# Patient Record
Sex: Female | Born: 1975 | Race: White | Hispanic: No | Marital: Married | Smoking: Never smoker
Health system: Southern US, Community
[De-identification: ages and names within clinical notes are randomized; demographics above are authoritative.]

## PROBLEM LIST (undated history)

## (undated) DIAGNOSIS — Z8742 Personal history of other diseases of the female genital tract: Secondary | ICD-10-CM

## (undated) DIAGNOSIS — R51 Headache: Secondary | ICD-10-CM

## (undated) DIAGNOSIS — R519 Headache, unspecified: Secondary | ICD-10-CM

## (undated) DIAGNOSIS — J302 Other seasonal allergic rhinitis: Secondary | ICD-10-CM

## (undated) DIAGNOSIS — Z9889 Other specified postprocedural states: Secondary | ICD-10-CM

## (undated) DIAGNOSIS — B019 Varicella without complication: Secondary | ICD-10-CM

## (undated) DIAGNOSIS — I10 Essential (primary) hypertension: Secondary | ICD-10-CM

## (undated) HISTORY — PX: ENDOMETRIAL ABLATION: SHX621

## (undated) HISTORY — DX: Other seasonal allergic rhinitis: J30.2

## (undated) HISTORY — DX: Essential (primary) hypertension: I10

## (undated) HISTORY — DX: Other specified postprocedural states: Z98.890

## (undated) HISTORY — DX: Varicella without complication: B01.9

## (undated) HISTORY — DX: Headache, unspecified: R51.9

## (undated) HISTORY — DX: Personal history of other diseases of the female genital tract: Z87.42

## (undated) HISTORY — DX: Headache: R51

---

## 1997-08-21 HISTORY — PX: OTHER SURGICAL HISTORY: SHX169

## 1998-03-02 ENCOUNTER — Other Ambulatory Visit: Admission: RE | Admit: 1998-03-02 | Discharge: 1998-03-02 | Payer: Self-pay | Admitting: *Deleted

## 1998-09-17 ENCOUNTER — Emergency Department (HOSPITAL_COMMUNITY): Admission: EM | Admit: 1998-09-17 | Discharge: 1998-09-18 | Payer: Self-pay | Admitting: Internal Medicine

## 1999-03-14 ENCOUNTER — Other Ambulatory Visit: Admission: RE | Admit: 1999-03-14 | Discharge: 1999-03-14 | Payer: Self-pay | Admitting: Obstetrics and Gynecology

## 1999-08-22 HISTORY — PX: BREAST ENHANCEMENT SURGERY: SHX7

## 1999-09-03 ENCOUNTER — Emergency Department (HOSPITAL_COMMUNITY): Admission: EM | Admit: 1999-09-03 | Discharge: 1999-09-03 | Payer: Self-pay

## 2000-03-02 ENCOUNTER — Other Ambulatory Visit: Admission: RE | Admit: 2000-03-02 | Discharge: 2000-03-02 | Payer: Self-pay | Admitting: Family Medicine

## 2001-03-12 ENCOUNTER — Other Ambulatory Visit: Admission: RE | Admit: 2001-03-12 | Discharge: 2001-03-12 | Payer: Self-pay | Admitting: Family Medicine

## 2001-04-09 ENCOUNTER — Other Ambulatory Visit: Admission: RE | Admit: 2001-04-09 | Discharge: 2001-04-09 | Payer: Self-pay | Admitting: Family Medicine

## 2002-04-28 ENCOUNTER — Other Ambulatory Visit: Admission: RE | Admit: 2002-04-28 | Discharge: 2002-04-28 | Payer: Self-pay | Admitting: Family Medicine

## 2002-11-17 ENCOUNTER — Emergency Department (HOSPITAL_COMMUNITY): Admission: EM | Admit: 2002-11-17 | Discharge: 2002-11-17 | Payer: Self-pay | Admitting: Emergency Medicine

## 2003-05-12 ENCOUNTER — Other Ambulatory Visit: Admission: RE | Admit: 2003-05-12 | Discharge: 2003-05-12 | Payer: Self-pay | Admitting: Family Medicine

## 2003-07-30 ENCOUNTER — Ambulatory Visit (HOSPITAL_COMMUNITY): Admission: RE | Admit: 2003-07-30 | Discharge: 2003-07-30 | Payer: Self-pay | Admitting: Family Medicine

## 2004-05-24 ENCOUNTER — Other Ambulatory Visit: Admission: RE | Admit: 2004-05-24 | Discharge: 2004-05-24 | Payer: Self-pay | Admitting: *Deleted

## 2004-06-21 ENCOUNTER — Ambulatory Visit: Payer: Self-pay

## 2004-07-05 ENCOUNTER — Emergency Department (HOSPITAL_COMMUNITY): Admission: EM | Admit: 2004-07-05 | Discharge: 2004-07-05 | Payer: Self-pay | Admitting: Emergency Medicine

## 2005-06-27 ENCOUNTER — Other Ambulatory Visit: Admission: RE | Admit: 2005-06-27 | Discharge: 2005-06-27 | Payer: Self-pay | Admitting: Family Medicine

## 2006-04-17 ENCOUNTER — Other Ambulatory Visit: Admission: RE | Admit: 2006-04-17 | Discharge: 2006-04-17 | Payer: Self-pay | Admitting: Family Medicine

## 2007-03-09 ENCOUNTER — Inpatient Hospital Stay (HOSPITAL_COMMUNITY): Admission: AD | Admit: 2007-03-09 | Discharge: 2007-03-12 | Payer: Self-pay | Admitting: Obstetrics & Gynecology

## 2007-03-10 ENCOUNTER — Encounter (INDEPENDENT_AMBULATORY_CARE_PROVIDER_SITE_OTHER): Payer: Self-pay | Admitting: Obstetrics and Gynecology

## 2007-03-24 ENCOUNTER — Inpatient Hospital Stay (HOSPITAL_COMMUNITY): Admission: AD | Admit: 2007-03-24 | Discharge: 2007-03-24 | Payer: Self-pay | Admitting: Obstetrics & Gynecology

## 2007-08-22 HISTORY — PX: TUBAL LIGATION: SHX77

## 2007-08-22 HISTORY — PX: SHOULDER SURGERY: SHX246

## 2007-08-31 ENCOUNTER — Emergency Department (HOSPITAL_COMMUNITY): Admission: EM | Admit: 2007-08-31 | Discharge: 2007-08-31 | Payer: Self-pay | Admitting: Emergency Medicine

## 2007-10-07 ENCOUNTER — Encounter: Admission: RE | Admit: 2007-10-07 | Discharge: 2007-10-07 | Payer: Self-pay | Admitting: Specialist

## 2008-01-01 ENCOUNTER — Ambulatory Visit: Payer: Self-pay

## 2008-01-14 ENCOUNTER — Ambulatory Visit: Payer: Self-pay

## 2008-12-28 ENCOUNTER — Emergency Department (HOSPITAL_COMMUNITY): Admission: EM | Admit: 2008-12-28 | Discharge: 2008-12-28 | Payer: Self-pay | Admitting: Emergency Medicine

## 2009-10-19 ENCOUNTER — Ambulatory Visit (HOSPITAL_COMMUNITY): Admission: RE | Admit: 2009-10-19 | Discharge: 2009-10-19 | Payer: Self-pay | Admitting: Obstetrics and Gynecology

## 2009-11-09 ENCOUNTER — Ambulatory Visit (HOSPITAL_COMMUNITY): Admission: RE | Admit: 2009-11-09 | Discharge: 2009-11-09 | Payer: Self-pay | Admitting: Obstetrics and Gynecology

## 2010-11-13 LAB — CBC
HCT: 34.5 % — ABNORMAL LOW (ref 36.0–46.0)
Hemoglobin: 11.7 g/dL — ABNORMAL LOW (ref 12.0–15.0)
MCHC: 33.9 g/dL (ref 30.0–36.0)
MCV: 91.1 fL (ref 78.0–100.0)
Platelets: 216 10*3/uL (ref 150–400)
RBC: 3.79 MIL/uL — ABNORMAL LOW (ref 3.87–5.11)
RDW: 13.1 % (ref 11.5–15.5)
WBC: 9.6 10*3/uL (ref 4.0–10.5)

## 2010-11-13 LAB — HCG, SERUM, QUALITATIVE: Preg, Serum: NEGATIVE

## 2010-11-29 LAB — CSF CULTURE W GRAM STAIN
Culture: NO GROWTH
Gram Stain: NONE SEEN

## 2010-11-29 LAB — CBC
HCT: 39.5 % (ref 36.0–46.0)
Hemoglobin: 13.8 g/dL (ref 12.0–15.0)
MCHC: 34.9 g/dL (ref 30.0–36.0)
MCV: 90.6 fL (ref 78.0–100.0)
Platelets: 210 10*3/uL (ref 150–400)
RBC: 4.36 MIL/uL (ref 3.87–5.11)
RDW: 13.4 % (ref 11.5–15.5)
WBC: 14.8 10*3/uL — ABNORMAL HIGH (ref 4.0–10.5)

## 2010-11-29 LAB — URINE MICROSCOPIC-ADD ON

## 2010-11-29 LAB — DIFFERENTIAL
Basophils Absolute: 0 10*3/uL (ref 0.0–0.1)
Basophils Relative: 0 % (ref 0–1)
Eosinophils Absolute: 0.4 10*3/uL (ref 0.0–0.7)
Eosinophils Relative: 3 % (ref 0–5)
Lymphocytes Relative: 11 % — ABNORMAL LOW (ref 12–46)
Lymphs Abs: 1.6 10*3/uL (ref 0.7–4.0)
Monocytes Absolute: 1 10*3/uL (ref 0.1–1.0)
Monocytes Relative: 7 % (ref 3–12)
Neutro Abs: 11.8 10*3/uL — ABNORMAL HIGH (ref 1.7–7.7)
Neutrophils Relative %: 80 % — ABNORMAL HIGH (ref 43–77)

## 2010-11-29 LAB — POCT I-STAT, CHEM 8
BUN: 8 mg/dL (ref 6–23)
Calcium, Ion: 1.14 mmol/L (ref 1.12–1.32)
Chloride: 110 mEq/L (ref 96–112)
Creatinine, Ser: 0.8 mg/dL (ref 0.4–1.2)
Glucose, Bld: 100 mg/dL — ABNORMAL HIGH (ref 70–99)
HCT: 43 % (ref 36.0–46.0)
Hemoglobin: 14.6 g/dL (ref 12.0–15.0)
Potassium: 4.3 mEq/L (ref 3.5–5.1)
Sodium: 140 mEq/L (ref 135–145)
TCO2: 19 mmol/L (ref 0–100)

## 2010-11-29 LAB — URINALYSIS, ROUTINE W REFLEX MICROSCOPIC
Bilirubin Urine: NEGATIVE
Glucose, UA: NEGATIVE mg/dL
Ketones, ur: NEGATIVE mg/dL
Leukocytes, UA: NEGATIVE
Nitrite: NEGATIVE
Protein, ur: NEGATIVE mg/dL
Specific Gravity, Urine: 1.005 (ref 1.005–1.030)
Urobilinogen, UA: 0.2 mg/dL (ref 0.0–1.0)
pH: 6 (ref 5.0–8.0)

## 2010-11-29 LAB — PROTEIN, CSF: Total  Protein, CSF: 22 mg/dL (ref 15–45)

## 2010-11-29 LAB — CSF CELL COUNT WITH DIFFERENTIAL
RBC Count, CSF: 1 /mm3 — ABNORMAL HIGH
Tube #: 4
WBC, CSF: 2 /mm3 (ref 0–5)

## 2010-11-29 LAB — GRAM STAIN: Gram Stain: NONE SEEN

## 2010-11-29 LAB — GLUCOSE, CSF: Glucose, CSF: 64 mg/dL (ref 43–76)

## 2010-11-29 LAB — PREGNANCY, URINE: Preg Test, Ur: NEGATIVE

## 2011-01-03 NOTE — Op Note (Signed)
Catherine Barry, Catherine Barry             ACCOUNT NO.:  [id number]   MEDICAL RECORD NO.:  [id number]          PATIENT TYPE:  INP   LOCATION:  9123                          FACILITY:  [facility]   PHYSICIAN:  Lenoard Aden, M.D.DATE OF BIRTH:  [birth year]   DATE OF PROCEDURE:  03/10/2007  DATE OF DISCHARGE:                               OPERATIVE REPORT   PREOPERATIVE DIAGNOSIS:  Failure to descend, maternal fever in labor,  probable chorioamnionitis.   POSTOPERATIVE DIAGNOSIS:  Failure to descend, maternal fever in labor,  probable chorioamnionitis.  Plus left occiput posterior presentation.   PROCEDURE:  Primary low segment transverse cesarean section.   SURGEON:  Lenoard Aden, M.D.   ASSISTANT:  None.   ANESTHESIA:  Epidural.   BLOOD LOSS:  1000 mL.   COMPLICATIONS:  None.   DRAINS:  Foley.   COUNTS:  Correct.  The patient to recovery in good condition.  Placenta  to pathology.   FINDINGS:  Full-term living female left occiput posterior position,  Apgars of 08/09.  Normal tubes, normal ovaries.  Two-layer uterine  closure performed.   DESCRIPTION OF PROCEDURE:  After being apprised risks of anesthesia,  infection, bleeding, injury to abdominal organs with need for repair,  delayed versus immediate complications to include bowel and bladder  injury.  The patient brought to the operating room where she was  administered dosing of epidural anesthetic without complications,  prepped, draped in sterile fashion.  Foley catheter placed. After  achieving adequate anesthesia Marcaine solution placed and Pfannenstiel  skin incision made with a scalpel, carried down to fascia which is  nicked in the midline and opened transversely using Mayo scissors.  Rectus muscles dissected sharply midline peritoneum entered sharply.  Bladder blade placed.  Visceral peritoneum scored sharply off the lower  uterine segment.  Kerr hysterotomy incision made.  Atraumatic delivery  of fetus as noted  position, handed to pediatricians attendance.  Apgars  08/09.  Nuchal cord times one reduced, bulb suctioning performed.  Placenta delivered manually intact three-vessel cord.  Uterus was  curetted using a dry lap pack, palpable normal anterior uterine cavity.  Normal tubes, normal ovaries.  Two-layer uterine closure with a 0  Monocryl in continuous running fashion, placed in a second imbricating  layer as noted.  Bladder flap inspected, found to be hemostatic.  Irrigation accomplished.  Peritoneum  reapproximated using a 2-0 chromic in a continuous running fashion.  The  fascia closed using a #1 Monocryl in continuous running fashion.  Skin  closed using staples.  The patient tolerates procedure well and is  transferred to recovery in good condition.      Lenoard Aden, M.D.  Electronically Signed     RJT/MEDQ  D:  03/10/2007  T:  03/10/2007  Job:  [id number]

## 2011-01-06 NOTE — Discharge Summary (Signed)
NAMEDEMICA, ZOOK             ACCOUNT NO.:  [id number]   MEDICAL RECORD NO.:  [id number]          PATIENT TYPE:  INP   LOCATION:  9123                          FACILITY:  [facility]   PHYSICIAN:  Lenoard Aden, M.D.DATE OF BIRTH:  [birth year]   DATE OF ADMISSION:  03/09/2007  DATE OF DISCHARGE:  03/12/2007                               DISCHARGE SUMMARY   The patient underwent a complicated primary cesarean section March 10, 2007.  Postoperative course uncomplicated.  Tolerated a regular diet  well.  Discharged to home on March 12, 2007, without complications.  Tylox, prenatal vitamins and iron given.  Follow up in the office in 4-6  weeks.      Lenoard Aden, M.D.  Electronically Signed     RJT/MEDQ  D:  04/24/2007  T:  04/25/2007  Job:  [id number]

## 2011-06-05 LAB — CBC
HCT: 36.2
HCT: 31 — ABNORMAL LOW
Hemoglobin: 10.5 — ABNORMAL LOW
MCV: 89.3
MCV: 91.1
Platelets: 231
RBC: 4.05
RBC: 3.39 — ABNORMAL LOW
RDW: 14.4 — ABNORMAL HIGH
WBC: 12.8 — ABNORMAL HIGH
WBC: 17.9 — ABNORMAL HIGH

## 2011-06-05 LAB — COMPREHENSIVE METABOLIC PANEL
AST: 19
Albumin: 2.5 — ABNORMAL LOW
BUN: 11
CO2: 27
Chloride: 105
Chloride: 109
Creatinine, Ser: 0.94
Creatinine, Ser: 0.61
GFR calc Af Amer: >60
GFR calc non Af Amer: >60
Glucose, Bld: 94
Potassium: 4.1
Total Bilirubin: 0.7
Total Bilirubin: 0.4

## 2011-06-05 LAB — URINALYSIS, ROUTINE W REFLEX MICROSCOPIC
Glucose, UA: NEGATIVE
Ketones, ur: NEGATIVE
Protein, ur: NEGATIVE

## 2011-06-05 LAB — URIC ACID
Uric Acid, Serum: 5.2
Uric Acid, Serum: 4.4

## 2013-03-03 ENCOUNTER — Encounter: Payer: Self-pay | Admitting: Family Medicine

## 2013-03-03 ENCOUNTER — Ambulatory Visit (INDEPENDENT_AMBULATORY_CARE_PROVIDER_SITE_OTHER): Payer: BC Managed Care – HMO | Admitting: Family Medicine

## 2013-03-03 ENCOUNTER — Ambulatory Visit (INDEPENDENT_AMBULATORY_CARE_PROVIDER_SITE_OTHER): Payer: BC Managed Care – HMO

## 2013-03-03 VITALS — BP 131/91 | HR 89 | Temp 97.5°F | Wt 160.8 lb

## 2013-03-03 DIAGNOSIS — S6990XA Unspecified injury of unspecified wrist, hand and finger(s), initial encounter: Secondary | ICD-10-CM

## 2013-03-03 DIAGNOSIS — K219 Gastro-esophageal reflux disease without esophagitis: Secondary | ICD-10-CM

## 2013-03-03 DIAGNOSIS — M779 Enthesopathy, unspecified: Secondary | ICD-10-CM

## 2013-03-03 DIAGNOSIS — S6991XA Unspecified injury of right wrist, hand and finger(s), initial encounter: Secondary | ICD-10-CM

## 2013-03-03 MED ORDER — OMEPRAZOLE 20 MG PO CPDR: 20.0000 mg | DELAYED_RELEASE_CAPSULE | Freq: Every day | ORAL | Status: DC

## 2013-03-03 MED ORDER — HYDROCODONE-IBUPROFEN 5-200 MG PO TABS: 1.0000 | ORAL_TABLET | Freq: Three times a day (TID) | ORAL | Status: DC | PRN

## 2013-03-03 NOTE — Progress Notes (Signed)
  Subjective:    Patient ID: Catherine Barry, female    DOB: [birth year], 37 y.o.   MRN: [id number]  HPI This 37 y.o. female presents for evaluation of injury to right hand and fingers.  She was using a drill At home and she states the drill bit stuck into the wood and the tourque from the drill twisted he right Hand and now she cannot use it and has severe pain.  She states she hurts on the 4th and 3rd knuckle and  Cannot make a fist or extend her hand.  She cannot work and type.   Review of Systems C/o right hand pain and arthralgias. No chest pain, SOB, HA, dizziness, vision change, N/V, diarrhea, constipation, dysuria, urinary urgency or frequency, or rash.     Objective:   Physical Exam  Vital signs noted  Well developed well nourished female.  HEENT - Barry atraumatic Normocephalic Respiratory - Lungs CTA bilateral Cardiac - RRR S1 and S2 without murmur MS - TTP right 2nd and 3rd knuckle and tenderness with extension and flexion of right  Hand.  No deformities.  ROM is limited in right hand.  Preliminary xray interpretation: Xray of right hand is negative for fracture.      Assessment & Plan:  Hand injury, right, initial encounter - Plan: DG Hand Complete Right, omeprazole (PRILOSEC) 20 MG capsule, hydrocodone-ibuprofen (VICOPROFEN) 5-200 MG per tablet. Splint to right 3rd finger and hand placed.  Recommend no work for a week and then follow up in a week if not better.  May need referral to Ortho hand specialist if not better.  Tendonitis - Plan: omeprazole (PRILOSEC) 20 MG capsule, hydrocodone-ibuprofen (VICOPROFEN) 5-200 MG per tablet.  Continue aleve otc as directed.     GERD - Prilosec 20mg  po qd x 2 weeks, then prn.

## 2013-03-03 NOTE — Patient Instructions (Signed)
Hand Injuries Minor breaks (fractures), sprains, bruises (contusions), and burns of the hand are all examples of hand injuries. A fracture is a break in the bone. A sprain means that ligaments have been stretched or torn. A contusion is the result of an injury that caused bleeding under the skin. Burns are damage to the skin that occurs when the hand comes in contact with something very hot (or with certain chemicals).  HOME CARE INSTRUCTIONS  For sprains, keep your hand raised (elevated) above the level of your heart. Do this until the pain and swelling improve.  Use hand bandages (dressings) and splints to reduce motion, relieve pain, and prevent reinjury as directed. The dressing and splint should not be removed without your caregiver's approval.  Put ice on the injured area to reduce pain and swelling due to fractures, sprains, and deep bruises.  Put ice in a plastic bag.  Place a towel between your skin and the bag.  Leave the ice on for 15-20 minutes, 3-4 times a day. Do this for 2 3 days.  Only take over-the-counter or prescription medicines as directed by your caregiver.  Follow up with your caregiver or a specialist as directed. Early motion exercises are sometimes needed to reduce joint stiffness after a hand injury. However, your hand should not be used for any activities that increase pain. SEEK MEDICAL CARE IF:  Your pain or swelling does not improve in 2 3 days. SEEK IMMEDIATE MEDICAL CARE IF:  You have increased pain, swelling, or redness in your hand.  Your pain is not controlled with medicine.  You have a fever or persistent symptoms for more than 2 3 days.  You have a fever and your symptoms suddenly get worse.  You have pain when moving your fingers.  You have pus coming from the wound.  You have numbness in your fingers. MAKE SURE YOU:  Understand these instructions.  Will watch your condition.  Will get help right away if you are not doing well or get  worse. Document Released: 09/14/2004 Document Revised: 05/01/2012 Document Reviewed: 02/18/2012 [employer] Patient Information 2014 [employer].  

## 2014-01-08 ENCOUNTER — Encounter: Payer: Self-pay | Admitting: Physician Assistant

## 2014-01-08 ENCOUNTER — Ambulatory Visit (INDEPENDENT_AMBULATORY_CARE_PROVIDER_SITE_OTHER): Payer: Managed Care, Other (non HMO) | Admitting: Physician Assistant

## 2014-01-08 VITALS — BP 130/84 | HR 103 | Temp 99.0°F | Resp 18 | Wt 163.0 lb

## 2014-01-08 DIAGNOSIS — R5383 Other fatigue: Principal | ICD-10-CM

## 2014-01-08 DIAGNOSIS — R5381 Other malaise: Secondary | ICD-10-CM

## 2014-01-08 DIAGNOSIS — J309 Allergic rhinitis, unspecified: Secondary | ICD-10-CM

## 2014-01-08 DIAGNOSIS — G4700 Insomnia, unspecified: Secondary | ICD-10-CM

## 2014-01-08 LAB — VITAMIN B12: VITAMIN B 12: 224 pg/mL (ref 211–911)

## 2014-01-08 LAB — CBC WITH DIFFERENTIAL/PLATELET
BASOS PCT: 1.1 % (ref 0.0–3.0)
Basophils Absolute: 0.1 10*3/uL (ref 0.0–0.1)
EOS PCT: 7.5 % — AB (ref 0.0–5.0)
Eosinophils Absolute: 0.8 10*3/uL — ABNORMAL HIGH (ref 0.0–0.7)
HCT: 38.1 % (ref 36.0–46.0)
Hemoglobin: 12.8 g/dL (ref 12.0–15.0)
LYMPHS PCT: 24.2 % (ref 12.0–46.0)
Lymphs Abs: 2.5 10*3/uL (ref 0.7–4.0)
MCHC: 33.5 g/dL (ref 30.0–36.0)
MCV: 92.8 fl (ref 78.0–100.0)
MONOS PCT: 6.8 % (ref 3.0–12.0)
Monocytes Absolute: 0.7 10*3/uL (ref 0.1–1.0)
NEUTROS PCT: 60.4 % (ref 43.0–77.0)
Neutro Abs: 6.3 10*3/uL (ref 1.4–7.7)
PLATELETS: 290 10*3/uL (ref 150.0–400.0)
RBC: 4.11 Mil/uL (ref 3.87–5.11)
RDW: 14 % (ref 11.5–15.5)
WBC: 10.4 10*3/uL (ref 4.0–10.5)

## 2014-01-08 LAB — BASIC METABOLIC PANEL
BUN: 10 mg/dL (ref 6–23)
CHLORIDE: 106 meq/L (ref 96–112)
CO2: 29 meq/L (ref 19–32)
Calcium: 9.3 mg/dL (ref 8.4–10.5)
Creatinine, Ser: 1 mg/dL (ref 0.4–1.2)
GFR: 65.09 mL/min (ref >60.00)
GLUCOSE: 96 mg/dL (ref 70–99)
POTASSIUM: 4.2 meq/L (ref 3.5–5.1)
SODIUM: 142 meq/L (ref 135–145)

## 2014-01-08 LAB — IBC PANEL
IRON: 70 ug/dL (ref 42–145)
SATURATION RATIOS: 16.4 % — AB (ref 20.0–50.0)
TRANSFERRIN: 304.9 mg/dL (ref 212.0–360.0)

## 2014-01-08 LAB — TSH: TSH: 0.31 u[IU]/mL — ABNORMAL LOW (ref 0.35–4.50)

## 2014-01-08 LAB — HEMOGLOBIN A1C: HEMOGLOBIN A1C: 5.4 % (ref 4.6–6.5)

## 2014-01-08 LAB — T4, FREE: Free T4: 0.8 ng/dL (ref 0.60–1.60)

## 2014-01-08 MED ORDER — TRAZODONE HCL 50 MG PO TABS: 25.0000 mg | ORAL_TABLET | Freq: Every evening | ORAL | Status: DC | PRN

## 2014-01-08 NOTE — Progress Notes (Signed)
Pre visit review using our clinic review tool, if applicable. No additional management support is needed unless otherwise documented below in the visit note. 

## 2014-01-08 NOTE — Patient Instructions (Addendum)
We will call you with your lab results when they are available.  Trial of Trazodone half to one tablet at bedtime as needed for sleep.  Plain Over the Counter Mucinex (NOT Mucinex D) for thick secretions  Force NON dairy fluids, drinking plenty of water is best.    Over the Counter Flonase OR Nasacort AQ 1 spray in each nostril twice a day as needed. Use the "crossover" technique into opposite nostril spraying toward opposite ear @ 45 degree angle, not straight up into nostril.   Plain Over the Counter Allegra (NOT D )  160 daily , OR Loratidine 10 mg , OR Zyrtec 10 mg @ bedtime  as needed for itchy eyes & sneezing.  Saline Irrigation and Saline Sprays can also help reduce symptoms.  Follow up in about 2 weeks to reassess. Follow up sooner if symptoms worsen or fail to improve despite treatment.  Hay Fever  Hay fever is a type of allergy that people have to things like grass, animals, or pollen from plants and flowers. It cannot be passed from one person to another. You cannot cure hay fever, but there are things that may help relieve your problems (symptoms). HOME CARE  Avoid the things that may be causing your problems.  Take all medicine as told by your doctor. GET HELP RIGHT AWAY IF:  You have asthma, a cough, and you start making whistling sounds when breathing (wheezing).  Your tongue or lips are puffy (swollen).  You have trouble breathing.  You feel lightheaded or like you will pass out (faint).  You have a fever.  Your problems are getting worse and your medicine is not helping.  Your treatment was working, but your problems have come back.  You are stuffed up (congested) and have pressure in your face.  You have a headache.  You have cold sweats. MAKE SURE YOU:  Understand these instructions.  Will watch your condition.  Will get help right away if you are not doing well or get worse. Document Released: 12/07/2010 Document Revised: 10/30/2011 Document  Reviewed: 12/07/2010 [facility] Patient Information 2014 [place], [place]. Fatigue Fatigue is a feeling of tiredness, lack of energy, lack of motivation, or feeling tired all the time. Having enough rest, good nutrition, and reducing stress will normally reduce fatigue. Consult your caregiver if it persists. The nature of your fatigue will help your caregiver to find out its cause. The treatment is based on the cause.  CAUSES  There are many causes for fatigue. Most of the time, fatigue can be traced to one or more of your habits or routines. Most causes fit into one or more of three general areas. They are: Lifestyle problems  Sleep disturbances.  Overwork.  Physical exertion.  Unhealthy habits.  Poor eating habits or eating disorders.  Alcohol and/or drug use .  Lack of proper nutrition (malnutrition). Psychological problems  Stress and/or anxiety problems.  Depression.  Grief.  Boredom. Medical Problems or Conditions  Anemia.  Pregnancy.  Thyroid gland problems.  Recovery from major surgery.  Continuous pain.  Emphysema or asthma that is not well controlled  Allergic conditions.  Diabetes.  Infections (such as mononucleosis).  Obesity.  Sleep disorders, such as sleep apnea.  Heart failure or other heart-related problems.  Cancer.  Kidney disease.  Liver disease.  Effects of certain medicines such as antihistamines, cough and cold remedies, prescription pain medicines, heart and blood pressure medicines, drugs used for treatment of cancer, and some antidepressants. SYMPTOMS  The symptoms  of fatigue include:   Lack of energy.  Lack of drive (motivation).  Drowsiness.  Feeling of indifference to the surroundings. DIAGNOSIS  The details of how you feel help guide your caregiver in finding out what is causing the fatigue. You will be asked about your present and past health condition. It is important to review all medicines that you take, including  prescription and non-prescription items. A thorough exam will be done. You will be questioned about your feelings, habits, and normal lifestyle. Your caregiver may suggest blood tests, urine tests, or other tests to look for common medical causes of fatigue.  TREATMENT  Fatigue is treated by correcting the underlying cause. For example, if you have continuous pain or depression, treating these causes will improve how you feel. Similarly, adjusting the dose of certain medicines will help in reducing fatigue.  HOME CARE INSTRUCTIONS   Try to get the required amount of good sleep every night.  Eat a healthy and nutritious diet, and drink enough water throughout the day.  Practice ways of relaxing (including yoga or meditation).  Exercise regularly.  Make plans to change situations that cause stress. Act on those plans so that stresses decrease over time. Keep your work and personal routine reasonable.  Avoid street drugs and minimize use of alcohol.  Start taking a daily multivitamin after consulting your caregiver. SEEK MEDICAL CARE IF:   You have persistent tiredness, which cannot be accounted for.  You have fever.  You have unintentional weight loss.  You have headaches.  You have disturbed sleep throughout the night.  You are feeling sad.  You have constipation.  You have dry skin.  You have gained weight.  You are taking any new or different medicines that you suspect are causing fatigue.  You are unable to sleep at night.  You develop any unusual swelling of your legs or other parts of your body. SEEK IMMEDIATE MEDICAL CARE IF:   You are feeling confused.  Your vision is blurred.  You feel faint or pass out.  You develop severe headache.  You develop severe abdominal, pelvic, or back pain.  You develop chest pain, shortness of breath, or an irregular or fast heartbeat.  You are unable to pass a normal amount of urine.  You develop abnormal bleeding such  as bleeding from the rectum or you vomit blood.  You have thoughts about harming yourself or committing suicide.  You are worried that you might harm someone else. MAKE SURE YOU:   Understand these instructions.  Will watch your condition.  Will get help right away if you are not doing well or get worse. Document Released: 06/04/2007 Document Revised: 10/30/2011 Document Reviewed: 06/04/2007 [facility] Patient Information 2014 [place].

## 2014-01-08 NOTE — Progress Notes (Signed)
Subjective:    Patient ID: Catherine Barry, female    DOB: Jan 06, 1976, 38 y.o.   MRN: [id number]  HPI Patient is a 38 year old Caucasian female presenting to the clinic for allergies and fatigue.  The first complaint is allergies. The patient states that over the last week she has been experiencing runny nose, itchy watery eyes, lightheadedness, and sinus drainage. She states that she has been taking Benadryl which has offered mild relief.  Second complaint is fatigue. Patient states that for the past month she has been fatigued and has not been sleeping well. Patient states that she is able to fall asleep and has a bedtime between 9 and 10 PM every night, however she always wakes up around 2 AM and is unable to fall back asleep. The patient has tried Ambien in the past however this made her sleep walk, and she has been taking Benadryl for allergies which makes her tired, however does help her stay asleep. She states that in the past she has had low iron, and her mother has a history of thyroid problems. She also reports a possible 10 pound weight gain over the past month, however she normally checks her weight with an at home scale which she has not noticed any significant weight gain by, and only noticed a 10 pound weight gain with the clinic scale today as compared with her home scale. She denies fevers, nausea, vomiting, diarrhea, shortness of breath, chest pain, vertigo, syncope.   Review of Systems As per history of present illness and are otherwise negative.   History reviewed. No pertinent past medical history. History reviewed. No pertinent past surgical history.  reports that she has never smoked. She does not have any smokeless tobacco history on file. She reports that she does not drink alcohol or use illicit drugs. family history is not on file. Allergies  Allergen Reactions  . Codeine   . Penicillins Rash    The PFS was reviewed with pt at time of visit.    Objective:   Physical Exam  Nursing note and vitals reviewed. Constitutional: She is oriented to person, place, and time. She appears well-developed and well-nourished. No distress.  HENT:  Head: Normocephalic and atraumatic.  Right Ear: External ear normal.  Left Ear: External ear normal.  Mouth/Throat: No oropharyngeal exudate.  Oropharynx slightly erythematous with cobblestoning, no exudate.  Nasal mucosa is boggy.  Bilateral tympanic membranes are bulging however otherwise are normal.  Bilateral frontal and maxillary sinuses are nontender to palpation.  Eyes: Conjunctivae and EOM are normal. Pupils are equal, round, and reactive to light.  Neck: Normal range of motion. Neck supple. No JVD present. No tracheal deviation present. No thyromegaly present.  Cardiovascular: Normal rate, regular rhythm, normal heart sounds and intact distal pulses.  Exam reveals no gallop and no friction rub.   No murmur heard. Pulmonary/Chest: Effort normal and breath sounds normal. No stridor. No respiratory distress. She has no wheezes. She has no rales. She exhibits no tenderness.  Musculoskeletal: Normal range of motion.  Lymphadenopathy:    She has no cervical adenopathy.  Neurological: She is alert and oriented to person, place, and time.  Skin: Skin is warm and dry. No rash noted. She is not diaphoretic. No erythema. No pallor.  Psychiatric: She has a normal mood and affect. Her behavior is normal. Judgment and thought content normal.    Filed Vitals:   01/08/14 1305  BP: 130/84  Pulse: 103  Temp: 99 F (37.2 C)  Resp: 18    Lab Results  Component Value Date   WBC 9.6 11/09/2009   HGB 11.7* 11/09/2009   HCT 34.5* 11/09/2009   PLT 216 11/09/2009   GLUCOSE 100* 12/28/2008   ALT 23 03/24/2007   AST 18 03/24/2007   NA 140 12/28/2008   K 4.3 12/28/2008   CL 110 12/28/2008   CREATININE 0.8 12/28/2008   BUN 8 12/28/2008   CO2 27 03/24/2007      Assessment & Plan:  Catherine Barry was seen today for fatigue and  allergic rhinitis .  Diagnoses and associated orders for this visit:  Other malaise and fatigue - CBC with Differential - Basic Metabolic Panel - TSH - T4, Free - Hemoglobin A1c - Vitamin B12 - IBC Panel  Allergic rhinitis - Will treat symptomatically with OTC medications as per the AVS.  Insomnia - traZODone (DESYREL) 50 MG tablet; Take 0.5-1 tablets (25-50 mg total) by mouth at bedtime as needed for sleep.  Plan to follow up in 2 weeks to reassess.   Patient Instructions  We will call you with your lab results when they are available.  Trial of Trazodone half to one tablet at bedtime as needed for sleep.  Plain Over the Counter Mucinex (NOT Mucinex D) for thick secretions  Force NON dairy fluids, drinking plenty of water is best.    Over the Counter Flonase OR Nasacort AQ 1 spray in each nostril twice a day as needed. Use the "crossover" technique into opposite nostril spraying toward opposite ear @ 45 degree angle, not straight up into nostril.   Plain Over the Counter Allegra (NOT D )  160 daily , OR Loratidine 10 mg , OR Zyrtec 10 mg @ bedtime  as needed for itchy eyes & sneezing.  Saline Irrigation and Saline Sprays can also help reduce symptoms.  Follow up in about 2 weeks to reassess. Follow up sooner if symptoms worsen or fail to improve despite treatment.

## 2014-01-09 ENCOUNTER — Telehealth: Payer: Self-pay | Admitting: Physician Assistant

## 2014-01-09 NOTE — Telephone Encounter (Signed)
Called pt and discussed lab results. Pt had low iron saturation but no anemia, and has hx of IDA, recommended she start an iron supplement. Unsure if this was contributing to her fatigue, but she will monitor symptoms to see if this helps them improve. TSH was low, however free t4 was normal, so likely no thyroid abnormality, will monitor in the future for thyroid symptoms. Slight eosinophilia, however normal WBC, possibly due to seasonal allergy flare. Pt is having less symptoms now taking zyrtec and Nasacort for a couple of days. Pt also states that she was able to sleep much better last night, and believes the trazodone is working. Plan to have a new pt appointment with Dr. Regis Bill next week, and will follow up with me in about 2 weeks to reassess symptoms.

## 2014-01-13 ENCOUNTER — Ambulatory Visit (INDEPENDENT_AMBULATORY_CARE_PROVIDER_SITE_OTHER): Payer: Managed Care, Other (non HMO) | Admitting: Internal Medicine

## 2014-01-13 ENCOUNTER — Encounter: Payer: Self-pay | Admitting: Internal Medicine

## 2014-01-13 VITALS — BP 140/80 | Temp 98.3°F | Ht 64.75 in | Wt 160.0 lb

## 2014-01-13 DIAGNOSIS — R790 Abnormal level of blood mineral: Secondary | ICD-10-CM

## 2014-01-13 DIAGNOSIS — R7989 Other specified abnormal findings of blood chemistry: Secondary | ICD-10-CM

## 2014-01-13 DIAGNOSIS — R946 Abnormal results of thyroid function studies: Secondary | ICD-10-CM

## 2014-01-13 DIAGNOSIS — R5383 Other fatigue: Principal | ICD-10-CM

## 2014-01-13 DIAGNOSIS — G479 Sleep disorder, unspecified: Secondary | ICD-10-CM

## 2014-01-13 DIAGNOSIS — R5381 Other malaise: Secondary | ICD-10-CM

## 2014-01-13 NOTE — Patient Instructions (Signed)
Your fatigue and sleep issue seems to be related to many factors. Mostly sleep deprivation.  Can take trazadone  As needed.can increase to 50 mg if needed.  Continue to get help with your daughters  Sleep issues . Avoid any caffeine after  2 pm.  Limit alcohol.  Your iron saturation is a bit low and sometimes taking iron supplement can help the sleep. b12 is low normal  Would add b12 ( or other b supplement of increase b contating foods.)  Your thyroid test was borderline abnormal but not be  clinically significant.  Needs  To be repeat.   Repeat in 1-2 months  b12 , mma, IBC and TSH free t3 and free t4 . Lipid panel.

## 2014-01-13 NOTE — Progress Notes (Signed)
Pre visit review using our clinic review tool, if applicable. No additional management support is needed unless otherwise documented below in the visit note.   Chief Complaint  Patient presents with  . Establish Care    Pt complains of fatigue and not feeling well overall.    HPI: Patient comes in today for  visit .  To establish   A 38 yo female NS  G1P1 Battling fatigue . Previous care was via [employer] where she is employed . Saw Bebe Liter initially and screening labs done .  Dr Ronita Hipps gyne last pap 11 2014  generally well mother wife and working full time .  Child 27 yo has nightmares  And she gets up with her.about 1 am or so ( husband sleep through)  And cant go back to sleep.  trazodone given and seems to help some at the 25 mg dose  No osa sx or rls . Hx of stress migraines   Health Maintenance  Topic Date Due  . Influenza Vaccine  03/21/2014  . Pap Smear  06/21/2016  . Tetanus/tdap  05/21/2017   Health Maintenance Review   ROS:  GEN/ HEENT: No fever, significant weight changes sweats headaches vision problems hearing changes, CV/ PULM; No chest pain shortness of breath cough, syncope,edema  change in exercise tolerance. GI /GU: No adominal pain, vomiting, change in bowel habits. No blood in the stool. No significant GU symptoms. SKIN/HEME: ,no acute skin rashes suspicious lesions or bleeding. No lymphadenopathy, nodules, masses.  NEURO/ PSYCH:  No neurologic signs such as weakness numbness. No depression anxiety. IMM/ Allergy: No unusual infections.  Allergy .   REST of 12 system review negative except as per HPI   Past Medical History  Diagnosis Date  . Seasonal allergies   . Frequent headaches   . Chicken pox   . Hx of abnormal cervical Pap smear     cin rx cryo 1999  . S/P endometrial ablation     Family History  Problem Relation Age of Onset  . Hypertension Mother     History   Social History  . Marital Status: Married    Spouse Name:  N/A    Number of Children: N/A  . Years of Education: N/A   Social History Main Topics  . Smoking status: Never Smoker   . Smokeless tobacco: None  . Alcohol Use: Yes  . Drug Use: No  . Sexual Activity: Yes    Partners: Male     Comment: Husband   Other Topics Concern  . None   Social History Narrative   4 hours of sleep per night (child has nightmares)   Lives with her husband and 61 year old girl   Has one outside dog. Does not come in the home.   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   [employer]   LIFESTYLE:    Exercise:  Not currently   Tobacco/ETS:no   Alcohol: 6-8 per week   Sugar beverages:no diet dr pepper    Sleep:   Drug use: no   FA    Outpatient Encounter Prescriptions as of 01/13/2014  Medication Sig  . traZODone (DESYREL) 50 MG tablet Take 0.5-1 tablets (25-50 mg total) by mouth at bedtime as needed for sleep.    EXAM:  BP 140/80  Temp(Src) 98.3 F (36.8 C) (Oral)  Ht 5' 4.75" (1.645 m)  Wt 160 lb (72.576 kg)  BMI 26.82 kg/m2  Body mass index is 26.82 kg/(m^2).  Physical Exam: Vital signs reviewed SUO:RVIF is a well-developed well-nourished alert cooperative    who appearsr stated age in no acute distress.  HEENT: normocephalic atraumatic , Eyes: PERRL EOM's full, conjunctiva clear, Nares: paten,t no deformity discharge or tenderness., Ears: no deformity EAC's clear TMs with normal landmarks. Mouth: clear OP, no lesions, edema.  Moist mucous membranes. Dentition in adequate repair. NECK: supple without masses, thyromegaly or bruits. CHEST/PULM:  Clear to auscultation and percussion breath sounds equal no wheeze , rales or rhonchi.  CV: PMI is nondisplaced, S1 S2 no gallops, murmurs, rubs. Peripheral pulses are full without delay.No JVD .  ABDOMEN: Bowel sounds normal nontender  No guard or rebound, no hepato splenomegal no CVA tenderness.  Extremtities:  No clubbing cyanosis or edema, no acute joint swelling or redness no focal  atrophy NEURO:  Oriented x3, cranial nerves 3-12 appear to be intact, no obvious focal weakness,gait within normal limits  SKIN: No acute rashes normal turgor, color, no bruising or petechiae. PSYCH: Oriented, good eye contact, no obvious depression anxiety, cognition and judgment appear normal. LN: no cervical adenopathy  Lab Results  Component Value Date   WBC 10.4 01/08/2014   HGB 12.8 01/08/2014   HCT 38.1 01/08/2014   PLT 290.0 01/08/2014   GLUCOSE 96 01/08/2014   ALT 23 03/24/2007   AST 18 03/24/2007   NA 142 01/08/2014   K 4.2 01/08/2014   CL 106 01/08/2014   CREATININE 1.0 01/08/2014   BUN 10 01/08/2014   CO2 29 01/08/2014   TSH 0.31* 01/08/2014   HGBA1C 5.4 01/08/2014    ASSESSMENT AND PLAN:  Discussed the following assessment and plan:  Other malaise and fatigue  Sleep disturbance  Abnormal TSH  Abnormal iron saturation low - prob mild iron defic  Milk    Iron deficiency had ablation and not bad. b12  Low normal  Add supplement   b and iron at this time.  No gi bleeding  Patient Care Team: Burnis Medin, MD as PCP - General (Internal Medicine) Lovenia Kim, MD as Consulting Physician (Obstetrics and Gynecology) Patient Instructions  Your fatigue and sleep issue seems to be related to many factors. Mostly sleep deprivation.  Can take trazadone  As needed.can increase to 50 mg if needed.  Continue to get help with your daughters  Sleep issues . Avoid any caffeine after  2 pm.  Limit alcohol.  Your iron saturation is a bit low and sometimes taking iron supplement can help the sleep. b12 is low normal  Would add b12 ( or other b supplement of increase b contating foods.)  Your thyroid test was borderline abnormal but not be  clinically significant.  Needs  To be repeat.   Repeat in 1-2 months  b12 , mma, IBC and TSH free t3 and free t4 . Lipid panel.    Standley Brooking. Panosh M.D.

## 2014-01-17 ENCOUNTER — Encounter: Payer: Self-pay | Admitting: Internal Medicine

## 2014-01-17 DIAGNOSIS — R7989 Other specified abnormal findings of blood chemistry: Secondary | ICD-10-CM | POA: Insufficient documentation

## 2014-01-17 DIAGNOSIS — G479 Sleep disorder, unspecified: Secondary | ICD-10-CM | POA: Insufficient documentation

## 2014-01-17 DIAGNOSIS — R5383 Other fatigue: Principal | ICD-10-CM

## 2014-01-17 DIAGNOSIS — R790 Abnormal level of blood mineral: Secondary | ICD-10-CM | POA: Insufficient documentation

## 2014-01-17 DIAGNOSIS — R5381 Other malaise: Secondary | ICD-10-CM | POA: Insufficient documentation

## 2014-01-22 ENCOUNTER — Ambulatory Visit: Payer: Managed Care, Other (non HMO) | Admitting: Physician Assistant

## 2014-02-18 ENCOUNTER — Other Ambulatory Visit (INDEPENDENT_AMBULATORY_CARE_PROVIDER_SITE_OTHER): Payer: Managed Care, Other (non HMO)

## 2014-02-18 DIAGNOSIS — R5381 Other malaise: Secondary | ICD-10-CM

## 2014-02-18 DIAGNOSIS — R5383 Other fatigue: Secondary | ICD-10-CM

## 2014-02-18 DIAGNOSIS — R7989 Other specified abnormal findings of blood chemistry: Secondary | ICD-10-CM

## 2014-02-18 LAB — IBC PANEL
Iron: 166 ug/dL — ABNORMAL HIGH (ref 42–145)
Saturation Ratios: 37.8 % (ref 20.0–50.0)
TRANSFERRIN: 313.6 mg/dL (ref 212.0–360.0)

## 2014-02-18 LAB — LIPID PANEL
CHOLESTEROL: 227 mg/dL — AB (ref 0–200)
HDL: 98.8 mg/dL (ref >39.00)
LDL Cholesterol: 113 mg/dL — ABNORMAL HIGH (ref 0–99)
NONHDL: 128.2
Total CHOL/HDL Ratio: 2
Triglycerides: 74 mg/dL (ref 0.0–149.0)
VLDL: 14.8 mg/dL (ref 0.0–40.0)

## 2014-02-18 LAB — T3, FREE: T3 FREE: 2.6 pg/mL (ref 2.3–4.2)

## 2014-02-18 LAB — VITAMIN B12: Vitamin B-12: 215 pg/mL (ref 211–911)

## 2014-02-18 LAB — TSH: TSH: 1.03 u[IU]/mL (ref 0.35–4.50)

## 2014-02-18 LAB — T4, FREE: FREE T4: 0.82 ng/dL (ref 0.60–1.60)

## 2014-02-20 LAB — METHYLMALONIC ACID, SERUM: Methylmalonic Acid, Quant: 106 nmol/L (ref 87–318)

## 2014-02-26 ENCOUNTER — Encounter: Payer: Self-pay | Admitting: Internal Medicine

## 2014-02-26 ENCOUNTER — Other Ambulatory Visit: Payer: Managed Care, Other (non HMO)

## 2014-02-26 ENCOUNTER — Ambulatory Visit (INDEPENDENT_AMBULATORY_CARE_PROVIDER_SITE_OTHER): Payer: Managed Care, Other (non HMO) | Admitting: Internal Medicine

## 2014-02-26 VITALS — BP 116/66 | Temp 98.9°F | Ht 64.27 in | Wt 159.0 lb

## 2014-02-26 DIAGNOSIS — R5383 Other fatigue: Principal | ICD-10-CM

## 2014-02-26 DIAGNOSIS — R790 Abnormal level of blood mineral: Secondary | ICD-10-CM

## 2014-02-26 DIAGNOSIS — R7989 Other specified abnormal findings of blood chemistry: Secondary | ICD-10-CM

## 2014-02-26 DIAGNOSIS — R5381 Other malaise: Secondary | ICD-10-CM

## 2014-02-26 NOTE — Progress Notes (Signed)
Pre visit review using our clinic review tool, if applicable. No additional management support is needed unless otherwise documented below in the visit note.  Chief Complaint  Patient presents with  . Follow-up    fatigue low iron sleep etc    HPI: Pt comesin for fu fatigue evaluation  Here with daughter  dogin some better  Taking 25 trazadone easier to go back to sleep here with young child still having night mares but "working on it"    Not as tired   Getting back to sleep  When get up at 3-4 want.   Taking iron also had repeat labs ROS: See pertinent positives and negatives per HPI.no cp sob  New sx  No gi bleeding  Past Medical History  Diagnosis Date  . Seasonal allergies   . Frequent headaches   . Chicken pox   . Hx of abnormal cervical Pap smear     cin rx cryo 1999  . S/P endometrial ablation     Family History  Problem Relation Age of Onset  . Hypertension Mother     History   Social History  . Marital Status: Married    Spouse Name: N/A    Number of Children: N/A  . Years of Education: N/A   Social History Main Topics  . Smoking status: Never Smoker   . Smokeless tobacco: None  . Alcohol Use: Yes  . Drug Use: No  . Sexual Activity: Yes    Partners: Male     Comment: Husband   Other Topics Concern  . None   Social History Narrative   4 hours of sleep per night (child has nightmares)   Lives with her husband and 45 year old girl   Has one outside dog. Does not come in the home.   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   [employer]   LIFESTYLE:    Exercise:  Not currently   Tobacco/ETS:no   Alcohol: 6-8 per week   Sugar beverages:no exc lots of  diet dr pepper    Sleep:   Drug use: no   FA    Outpatient Encounter Prescriptions as of 02/26/2014  Medication Sig  . traZODone (DESYREL) 50 MG tablet Take 0.5-1 tablets (25-50 mg total) by mouth at bedtime as needed for sleep.    EXAM:  BP 116/66  Temp(Src) 98.9 F (37.2 C) (Oral)   Ht 5' 4.27" (1.632 m)  Wt 159 lb (72.122 kg)  BMI 27.08 kg/m2  Body mass index is 27.08 kg/(m^2).  GENERAL: vitals reviewed and listed above, alert, oriented, appears well hydrated and in no acute distress HEENT: atraumatic, conjunctiva  clear, no obvious abnormalities on inspection of external nose and ears  NECK: no obvious masses on inspection palpation  LUNGS: clear to auscultation bilaterally, no wheezes, rales or rhonchi, good air movement CV: HRRR, no clubbing cyanosis or  peripheral edema nl cap refill  MS: moves all extremities without noticeable focal  abnormality PSYCH: pleasant and cooperative, no obvious depression or anxiety Lab Results  Component Value Date   WBC 10.4 01/08/2014   HGB 12.8 01/08/2014   HCT 38.1 01/08/2014   PLT 290.0 01/08/2014   GLUCOSE 96 01/08/2014   CHOL 227* 02/18/2014   TRIG 74.0 02/18/2014   HDL 98.80 02/18/2014   LDLCALC 113* 02/18/2014   ALT 23 03/24/2007   AST 18 03/24/2007   NA 142 01/08/2014   K 4.2 01/08/2014   CL 106 01/08/2014   CREATININE  1.0 01/08/2014   BUN 10 01/08/2014   CO2 29 01/08/2014   TSH 1.03 02/18/2014   HGBA1C 5.4 01/08/2014  [facility] from  16.4- 37.8 mma nl b12 low nl   ASSESSMENT AND PLAN:  Discussed the following assessment and plan:  Other malaise and fatigue - imporved sleep issue cont and  trazadone as needed  Abnormal TSH  Abnormal iron saturation - improved  to normal  take iron one more month and then stop( no gi or gi bleeding) Iron better b12 same but nl mma  Repeat tsh normal   Poss lab issue but repeat in 6 months  -Patient advised to return or notify health care team  if symptoms worsen ,persist or new concerns arise.  Patient Instructions  Take iron for another month  And then try off. Add b12 in diet or vitamin .  Thyroid is better .   But would recheck.   Iron levels and  Thyroid at that time.  Continue working on sleep.       Standley Brooking. Caylor Tallarico M.D.

## 2014-02-26 NOTE — Patient Instructions (Signed)
Take iron for another month  And then try off. Add b12 in diet or vitamin .  Thyroid is better .   But would recheck.   Iron levels and  Thyroid at that time.  Continue working on sleep.

## 2014-03-30 ENCOUNTER — Ambulatory Visit (INDEPENDENT_AMBULATORY_CARE_PROVIDER_SITE_OTHER): Payer: Managed Care, Other (non HMO) | Admitting: Physician Assistant

## 2014-03-30 ENCOUNTER — Encounter: Payer: Self-pay | Admitting: Physician Assistant

## 2014-03-30 VITALS — BP 120/72 | HR 79 | Temp 98.8°F | Resp 18 | Ht 64.27 in | Wt 165.5 lb

## 2014-03-30 DIAGNOSIS — M25561 Pain in right knee: Secondary | ICD-10-CM

## 2014-03-30 DIAGNOSIS — M25569 Pain in unspecified knee: Secondary | ICD-10-CM

## 2014-03-30 MED ORDER — MELOXICAM 7.5 MG PO TABS: 7.5000 mg | ORAL_TABLET | Freq: Every day | ORAL | Status: DC
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
## 2014-03-30 NOTE — Progress Notes (Signed)
Pre visit review using our clinic review tool, if applicable. No additional management support is needed unless otherwise documented below in the visit note. 

## 2014-03-30 NOTE — Patient Instructions (Addendum)
MOBIC once daily for inflammation and pain relief.   You cannot take Advil, ibuprofen, Motrin, Aleve, naproxen with MOBIC as this will increase her chance of having adverse stomach or heart effects.  You can take Tylenol if needed to additionally help pain and inflammation.  Rice therapy as discussed.  You'll be called to schedule an appointment with an orthopedist to evaluate your knee.  If emergency symptoms discussed during visit developed, seek medical attention immediately.  Followup as needed, or for worsening or persistent symptoms despite treatment.   Knee Effusion  Knee effusion means you have fluid in your knee. The knee may be more difficult to bend and move. HOME CARE  Use crutches or a brace as told by your doctor.  Put ice on the injured area.  Put ice in a plastic bag.  Place a towel between your skin and the bag.  Leave the ice on for 15-20 minutes, 03-04 times a day.  Raise (elevate) your knee as much as possible.  Only take medicine as told by your doctor.  You may need to do strengthening exercises. Ask your doctor.  Continue with your normal diet and activities as told by your doctor. GET HELP RIGHT AWAY IF:  You have more puffiness (swelling) in your knee.  You see redness, puffiness, or have more pain in your knee.  You have a temperature by mouth above 102 F (38.9 C).  You get a rash.  You have trouble breathing.  You have a reaction to any medicine you are taking.  You have a lot of pain when you move your knee. MAKE SURE YOU:  Understand these instructions.  Will watch your condition.  Will get help right away if you are not doing well or get worse. Document Released: 09/09/2010 Document Revised: 10/30/2011 Document Reviewed: 09/09/2010 [facility] Patient Information 2015 [place], [place]. This information is not intended to replace advice given to you by your health care provider. Make sure you discuss any questions you have with  your health care provider. RICE: Routine Care for Injuries Rest, Ice, Compression, and Elevation (RICE) are often used to care for injuries. HOME CARE  Rest your injury.  Put ice on the injury.  Put ice in a plastic bag.  Place a towel between your skin and the bag.  Leave the ice on for 15-20 minutes, 03-04 times a day. Do this for as long as told by your doctor.  Apply pressure (compression) with an elastic bandage. Remove and reapply the bandage every 3 to 4 hours. Do not wrap the bandage too tight. Wrap the bandage looser if the fingers or toes are puffy (swollen), blue, cold, painful, or lose feeling (numb).  Raise (elevate) your injury. Raise your injury above the heart if you can. GET HELP RIGHT AWAY IF:  You have lasting pain or puffiness.  Your injury is red, weak, or loses feeling.  Your problems get worse, not better, after several days. MAKE SURE YOU:  Understand these instructions.  Will watch your condition.  Will get help right away if you are not doing well or get worse. Document Released: 01/24/2008 Document Revised: 10/30/2011 Document Reviewed: 01/06/2011 [facility] Patient Information 2015 [place], [place]. This information is not intended to replace advice given to you by your health care provider. Make sure you discuss any questions you have with your health care provider.

## 2014-06-22 ENCOUNTER — Encounter: Payer: Self-pay | Admitting: Physician Assistant

## 2014-11-22 ENCOUNTER — Ambulatory Visit (INDEPENDENT_AMBULATORY_CARE_PROVIDER_SITE_OTHER): Payer: Managed Care, Other (non HMO) | Admitting: Internal Medicine

## 2014-11-22 ENCOUNTER — Ambulatory Visit (INDEPENDENT_AMBULATORY_CARE_PROVIDER_SITE_OTHER): Payer: Managed Care, Other (non HMO)

## 2014-11-22 ENCOUNTER — Telehealth: Payer: Self-pay | Admitting: Physician Assistant

## 2014-11-22 ENCOUNTER — Encounter (HOSPITAL_BASED_OUTPATIENT_CLINIC_OR_DEPARTMENT_OTHER): Payer: Self-pay

## 2014-11-22 ENCOUNTER — Ambulatory Visit (HOSPITAL_BASED_OUTPATIENT_CLINIC_OR_DEPARTMENT_OTHER)
Admission: RE | Admit: 2014-11-22 | Discharge: 2014-11-22 | Disposition: A | Discharge status: Home / Self Care | Payer: Managed Care, Other (non HMO) | Source: Ambulatory Visit | Attending: Physician Assistant | Admitting: Physician Assistant

## 2014-11-22 ENCOUNTER — Other Ambulatory Visit: Payer: Self-pay | Admitting: Physician Assistant

## 2014-11-22 VITALS — BP 126/80 | HR 128 | Temp 98.0°F | Resp 20 | Ht 64.5 in | Wt 164.4 lb

## 2014-11-22 DIAGNOSIS — R197 Diarrhea, unspecified: Secondary | ICD-10-CM

## 2014-11-22 DIAGNOSIS — K769 Liver disease, unspecified: Secondary | ICD-10-CM | POA: Diagnosis not present

## 2014-11-22 DIAGNOSIS — R109 Unspecified abdominal pain: Secondary | ICD-10-CM | POA: Diagnosis not present

## 2014-11-22 DIAGNOSIS — Z6827 Body mass index (BMI) 27.0-27.9, adult: Secondary | ICD-10-CM | POA: Insufficient documentation

## 2014-11-22 DIAGNOSIS — K921 Melena: Secondary | ICD-10-CM | POA: Insufficient documentation

## 2014-11-22 DIAGNOSIS — R634 Abnormal weight loss: Secondary | ICD-10-CM

## 2014-11-22 LAB — POCT CBC
Granulocyte percent: 82.6 %G — AB (ref 37–80)
HCT, POC: 38.8 % (ref 37.7–47.9)
HEMOGLOBIN: 12.8 g/dL (ref 12.2–16.2)
Lymph, poc: 1.6 (ref 0.6–3.4)
MCH, POC: 29.7 pg (ref 27–31.2)
MCHC: 33.1 g/dL (ref 31.8–35.4)
MCV: 89.8 fL (ref 80–97)
MID (cbc): 0.5 (ref 0–0.9)
MPV: 7.5 fL (ref 0–99.8)
POC GRANULOCYTE: 9.7 — AB (ref 2–6.9)
POC LYMPH %: 13.2 % (ref 10–50)
POC MID %: 4.2 % (ref 0–12)
Platelet Count, POC: 275 10*3/uL (ref 142–424)
RBC: 4.32 M/uL (ref 4.04–5.48)
RDW, POC: 13.6 %
WBC: 11.8 10*3/uL — AB (ref 4.6–10.2)

## 2014-11-22 LAB — POCT URINALYSIS DIPSTICK
BILIRUBIN UA: NEGATIVE
GLUCOSE UA: NEGATIVE
KETONES UA: NEGATIVE
Leukocytes, UA: NEGATIVE
Nitrite, UA: NEGATIVE
PROTEIN UA: NEGATIVE
SPEC GRAV UA: 1.01
UROBILINOGEN UA: 0.2
pH, UA: 6

## 2014-11-22 LAB — COMPREHENSIVE METABOLIC PANEL
ALT: 13 U/L (ref 0–35)
AST: 16 U/L (ref 0–37)
Albumin: 3.9 g/dL (ref 3.5–5.2)
Alkaline Phosphatase: 86 U/L (ref 39–117)
BILIRUBIN TOTAL: 0.2 mg/dL (ref 0.2–1.2)
BUN: 5 mg/dL — AB (ref 6–23)
CALCIUM: 9.1 mg/dL (ref 8.4–10.5)
CO2: 23 mEq/L (ref 19–32)
Chloride: 104 mEq/L (ref 96–112)
Creat: 0.82 mg/dL (ref 0.50–1.10)
GLUCOSE: 99 mg/dL (ref 70–99)
Potassium: 4 mEq/L (ref 3.5–5.3)
Sodium: 137 mEq/L (ref 135–145)
Total Protein: 6.6 g/dL (ref 6.0–8.3)

## 2014-11-22 LAB — POCT UA - MICROSCOPIC ONLY
Casts, Ur, LPF, POC: NEGATIVE
Crystals, Ur, HPF, POC: NEGATIVE
Mucus, UA: NEGATIVE
Yeast, UA: NEGATIVE

## 2014-11-22 LAB — TSH: TSH: 1.87 u[IU]/mL (ref 0.350–4.500)

## 2014-11-22 MED ORDER — IOHEXOL 300 MG/ML  SOLN
50.0000 mL | Freq: Once | INTRAMUSCULAR | Status: AC | PRN
  Administered 2014-11-22: 50 mL via ORAL

## 2014-11-22 MED ORDER — CIPROFLOXACIN HCL 500 MG PO TABS: 500.0000 mg | ORAL_TABLET | Freq: Two times a day (BID) | ORAL | Status: DC

## 2014-11-22 MED ORDER — METRONIDAZOLE 500 MG PO TABS: 500.0000 mg | ORAL_TABLET | Freq: Three times a day (TID) | ORAL | Status: DC

## 2014-11-22 MED ORDER — IOHEXOL 300 MG/ML  SOLN: 50.0000 mL | Freq: Once | INTRAMUSCULAR | Status: DC | PRN

## 2014-11-22 MED ORDER — DIPHENOXYLATE-ATROPINE 2.5-0.025 MG PO TABS: 1.0000 | ORAL_TABLET | Freq: Four times a day (QID) | ORAL | Status: DC | PRN

## 2014-11-22 MED ORDER — IOHEXOL 300 MG/ML  SOLN: 100.0000 mL | Freq: Once | INTRAMUSCULAR | Status: AC | PRN

## 2014-11-22 NOTE — Telephone Encounter (Signed)
Reviewed results of CT scan: Colitis.  Proceed with cipro + flagyl. Hydrate. RTC if symptoms persist. To ED if worsen.

## 2014-11-22 NOTE — Patient Instructions (Signed)
Go to [employer] [place] ph# [phone] and register at the emergency department for OUTPATIENT CT. DO NOT register as ED patient.

## 2014-11-22 NOTE — Progress Notes (Signed)
Subjective:   Patient ID: Catherine Barry, female     DOB: 1976/01/17, 38 y.o.    MRN: [id number]  PCP: Lottie Dawson, MD  Chief Complaint  Patient presents with  . Abdominal Cramping    X 4 days, worsening  . Diarrhea    X  4 days  . Blood In Stools    X today  . No appetite  . Weight Loss    Lost 12lbs since illness     Prior to Admission medications   Medication Sig Start Date End Date Taking? Authorizing Provider  traZODone (DESYREL) 50 MG tablet Take 0.5-1 tablets (25-50 mg total) by mouth at bedtime as needed for sleep. 01/08/14  Yes Zenaida Niece, PA-C     Allergies  Allergen Reactions  . Codeine   . Penicillins Rash     Patient Active Problem List   Diagnosis Date Noted  . BMI 27.0-27.9,adult 11/22/2014  . Sleep disturbance 01/17/2014  . Other malaise and fatigue 01/17/2014  . Abnormal TSH 01/17/2014  . Abnormal iron saturation low 01/17/2014     Family History  Problem Relation Age of Onset  . Hypertension Mother   . Thyroid disease Mother     hypothyroidism  . Hyperlipidemia Mother      History   Social History  . Marital Status: Married    Spouse Name: Harrell Gave  . Number of Children: 1  . Years of Education: college+   Occupational History  . Therapist, art company     self-employed  . contractor    Social History Main Topics  . Smoking status: Never Smoker   . Smokeless tobacco: Never Used  . Alcohol Use: 3.6 - 6.0 oz/week    6-10 Standard drinks or equivalent per week  . Drug Use: No  . Sexual Activity:    Partners: Male     Comment: Husband   Other Topics Concern  . Not on file   Social History Narrative   4 hours of sleep per night (child has nightmares)   Lives with her husband and 30 year old girl   Has one outside dog. Does not come in the home.   Chemist, Engineer, structural; masters level education.   Has 4 patents.      LIFESTYLE:    Exercise:  Not currently   Tobacco/ETS:no   Alcohol: 6-8 per week   Sugar beverages:no exc lots of  diet dr pepper    Drug use: no   FA     HPI Presents for evaluation of 4 days of abdominal cramping, progressively worsening, diarrhea and weight loss (reported 12 lbs in 4 days) and now bright red blood in the stool.  She is accompanied by her mother.  No nausea or vomiting. BRBPR. No melena. Yesterday stool was bright green. This morning it was yellowish, but with bright red blood. No previous symptoms like this, though has had GI illnesses with nausea/vomiting and diarrhea.. No sick contacts. No recent change in eating. Only recent travel was to [place], [place].  She ate a chicken salad sandwich the day before the onset of symptoms. Subjective fever/chills with diaphoresis the first two days, none since. Acetaminophen and immodium without relief. ABle to drink liquids without difficulty. Cramping eases off after BM. Eating increases pain and frequency of stools, then tends to ease up for about 90 minutes before worsens again. "Feels raw."  Review of Systems Review of Systems  Constitutional: Positive for fever, chills, diaphoresis, fatigue and unexpected  weight change.  HENT: Negative.   Respiratory: Negative for cough and shortness of breath.   Cardiovascular: Negative for chest pain and palpitations.  Gastrointestinal: Positive for abdominal pain (cramping), diarrhea and blood in stool. Negative for nausea, vomiting, constipation, abdominal distention and rectal pain.  Genitourinary: Negative for dysuria, urgency, frequency and hematuria.  Musculoskeletal: Negative for back pain, arthralgias and gait problem.  Skin: Negative for rash.  Neurological: Negative for dizziness, weakness, light-headedness and headaches.  Psychiatric/Behavioral: Positive for sleep disturbance (Trazodone isn't really helping).        Objective:  Physical Exam Physical Exam  Constitutional: She is oriented to person, place, and time. Vital  signs are normal. She appears well-developed and well-nourished. She is active and cooperative. No distress.  BP 126/80 mmHg  Pulse 128  Temp(Src) 98 F (36.7 C) (Oral)  Resp 20  Ht 5' 4.5" (1.638 m)  Wt 164 lb 6.4 oz (74.571 kg)  BMI 27.79 kg/m2  SpO2 99%  HENT:  Head: Normocephalic and atraumatic.  Right Ear: Hearing normal.  Left Ear: Hearing normal.  Eyes: Conjunctivae are normal. No scleral icterus.  Neck: Normal range of motion. Neck supple. No thyromegaly present.  Cardiovascular: Normal rate, regular rhythm and normal heart sounds.   Pulses:      Radial pulses are 2+ on the right side, and 2+ on the left side.  Pulmonary/Chest: Effort normal and breath sounds normal.  Abdominal: Normal appearance and bowel sounds are normal. She exhibits no distension and no mass. There is generalized tenderness (worst in the periumbilical, suprapubic and LEFT quadrants.). There is no CVA tenderness. No hernia.  Lymphadenopathy:       Head (right side): No tonsillar, no preauricular, no posterior auricular and no occipital adenopathy present.       Head (left side): No tonsillar, no preauricular, no posterior auricular and no occipital adenopathy present.    She has no cervical adenopathy.       Right: No supraclavicular adenopathy present.       Left: No supraclavicular adenopathy present.  Neurological: She is alert and oriented to person, place, and time. She has normal strength. No sensory deficit.  Skin: Skin is warm, dry and intact. No rash noted. No cyanosis or erythema. Nails show no clubbing.  Psychiatric: She has a normal mood and affect. Her speech is normal and behavior is normal.   Acute Abdominal Series: [facility] reading (PRIMARY) by  Dr. Laney Pastor. Few scattered air fluid levels on the RIGHT. Otherwise, normal abdominal films.  Results for orders placed or performed in visit on 11/22/14  POCT CBC  Result Value Ref Range   WBC 11.8 (A) 4.6 - 10.2 K/uL   Lymph, poc 1.6 0.6 - 3.4    POC LYMPH PERCENT 13.2 10 - 50 %L   MID (cbc) 0.5 0 - 0.9   POC MID % 4.2 0 - 12 %M   POC Granulocyte 9.7 (A) 2 - 6.9   Granulocyte percent 82.6 (A) 37 - 80 %G   RBC 4.32 4.04 - 5.48 M/uL   Hemoglobin 12.8 12.2 - 16.2 g/dL   HCT, POC 38.8 37.7 - 47.9 %   MCV 89.8 80 - 97 fL   MCH, POC 29.7 27 - 31.2 pg   MCHC 33.1 31.8 - 35.4 g/dL   RDW, POC 13.6 %   Platelet Count, POC 275 142 - 424 K/uL   MPV 7.5 0 - 99.8 fL  POCT UA - Microscopic Only  Result Value Ref Range  WBC, Ur, HPF, POC 3-5    RBC, urine, microscopic 5-10    Bacteria, U Microscopic 1+    Mucus, UA neg    Epithelial cells, urine per micros 3-10    Crystals, Ur, HPF, POC neg    Casts, Ur, LPF, POC neg    Yeast, UA neg   POCT urinalysis dipstick  Result Value Ref Range   Color, UA yellow    Clarity, UA clear    Glucose, UA neg    Bilirubin, UA neg    Ketones, UA neg    Spec Grav, UA 1.010    Blood, UA moderate Likely contaminant-non-clean catch based on epithelial cells   pH, UA 6.0    Protein, UA neg    Urobilinogen, UA 0.2    Nitrite, UA neg    Leukocytes, UA Negative              Assessment & Plan:  1. Diarrhea CT scan now. Slow diarrhea with Lomotil. Hydrate. Cover for possible infectious cause with Cipro and Flagyl. - Stool culture - Ova and parasite examination - Clostridium Difficile by PCR - POCT CBC - POCT UA - Microscopic Only - POCT urinalysis dipstick - Comprehensive metabolic panel - TSH - ciprofloxacin (CIPRO) 500 MG tablet; Take 1 tablet (500 mg total) by mouth 2 (two) times daily.  Dispense: 20 tablet; Refill: 0 - metroNIDAZOLE (FLAGYL) 500 MG tablet; Take 1 tablet (500 mg total) by mouth 3 (three) times daily. DO NOT CONSUME ALCOHOL WHILE TAKING THIS MEDICATION.  Dispense: 30 tablet; Refill: 0 - diphenoxylate-atropine (LOMOTIL) 2.5-0.025 MG per tablet; Take 1 tablet by mouth 4 (four) times daily as needed for diarrhea or loose stools.  Dispense: 30 tablet; Refill: 0 - CT Abdomen  Pelvis W Contrast; Future  2. Blood in stool See above. - CT Abdomen Pelvis W Contrast; Future  3. Abdominal cramping See above. - DG Abd Acute W/Chest - CT Abdomen Pelvis W Contrast; Future  4. Loss of weight Unable to eat adequate calories to maintain weight due to illness.Expect resolution with resolution of illness.   Fara Chute, PA-C Physician Assistant-Certified [facility]  I have participated in the care of this patient with the Advanced Practice Provider and agree with Diagnosis and Plan as documented. Robert P. Laney Pastor, M.D.

## 2014-11-23 ENCOUNTER — Encounter: Payer: Self-pay | Admitting: Internal Medicine

## 2014-11-23 LAB — CLOSTRIDIUM DIFFICILE BY PCR: Toxigenic C. Difficile by PCR: NOT DETECTED

## 2014-11-23 MED ORDER — FLUCONAZOLE 150 MG PO TABS: 150.0000 mg | ORAL_TABLET | Freq: Once | ORAL | Status: DC

## 2014-11-23 NOTE — Telephone Encounter (Signed)
FYI

## 2014-11-24 LAB — OVA AND PARASITE EXAMINATION: OP: NONE SEEN

## 2014-11-30 ENCOUNTER — Encounter: Payer: Self-pay | Admitting: Internal Medicine

## 2014-12-03 LAB — STOOL CULTURE

## 2015-09-16 ENCOUNTER — Ambulatory Visit: Payer: Managed Care, Other (non HMO) | Admitting: Family Medicine

## 2016-01-04 ENCOUNTER — Ambulatory Visit (INDEPENDENT_AMBULATORY_CARE_PROVIDER_SITE_OTHER): Payer: Managed Care, Other (non HMO) | Admitting: Physician Assistant

## 2016-01-04 VITALS — BP 168/88 | HR 102 | Temp 98.5°F | Resp 20 | Ht 64.5 in | Wt 180.0 lb

## 2016-01-04 DIAGNOSIS — S61011A Laceration without foreign body of right thumb without damage to nail, initial encounter: Secondary | ICD-10-CM

## 2016-01-04 DIAGNOSIS — Z23 Encounter for immunization: Secondary | ICD-10-CM

## 2016-01-04 DIAGNOSIS — M79644 Pain in right finger(s): Secondary | ICD-10-CM | POA: Diagnosis not present

## 2016-01-04 MED ORDER — DOXYCYCLINE HYCLATE 100 MG PO TABS: 100.0000 mg | ORAL_TABLET | Freq: Two times a day (BID) | ORAL | Status: DC

## 2016-01-04 NOTE — Progress Notes (Signed)
   Catherine Barry  MRN: [id number] DOB: [birth year]  Subjective:  Pt presents to clinic with laceration in web space of thumb and index finger that happened this am when she caught her hand in a metal rat trap - the trap was dirty but it was not rusty.  Her last tetanus was 2008.  There is local pain only.  Patient Active Problem List   Diagnosis Date Noted  . BMI 27.0-27.9,adult 11/22/2014  . Sleep disturbance 01/17/2014  . Other malaise and fatigue 01/17/2014  . Abnormal TSH 01/17/2014  . Abnormal iron saturation low 01/17/2014    Current Outpatient Prescriptions on File Prior to Visit  Medication Sig Dispense Refill  . diphenoxylate-atropine (LOMOTIL) 2.5-0.025 MG per tablet Take 1 tablet by mouth 4 (four) times daily as needed for diarrhea or loose stools. (Patient not taking: Reported on 01/04/2016) 30 tablet 0  . metroNIDAZOLE (FLAGYL) 500 MG tablet Take 1 tablet (500 mg total) by mouth 3 (three) times daily. DO NOT CONSUME ALCOHOL WHILE TAKING THIS MEDICATION. (Patient not taking: Reported on 01/04/2016) 30 tablet 0  . traZODone (DESYREL) 50 MG tablet Take 0.5-1 tablets (25-50 mg total) by mouth at bedtime as needed for sleep. (Patient not taking: Reported on 01/04/2016) 30 tablet 3   No current facility-administered medications on file prior to visit.    Allergies  Allergen Reactions  . Codeine   . Penicillins Rash    Review of Systems  Constitutional: Negative for fever and chills.  Skin: Positive for wound.   Objective:  BP 168/88 mmHg  Pulse 102  Temp(Src) 98.5 F (36.9 C) (Oral)  Resp 20  Ht 5' 4.5" (1.638 m)  Wt 180 lb (81.647 kg)  BMI 30.43 kg/m2  SpO2 99%  Physical Exam  Constitutional: She is oriented to person, place, and time and well-developed, well-nourished, and in no distress.  HENT:  Head: Normocephalic and atraumatic.  Right Ear: Hearing and external ear normal.  Left Ear: Hearing and external ear normal.  Eyes: Conjunctivae are normal.  Neck:  Normal range of motion.  Pulmonary/Chest: Effort normal.  Neurological: She is alert and oriented to person, place, and time. Gait normal.  Skin: Skin is warm and dry.  1.5 cm laceration in the web space of right thumb and index finger - good strength and sensation   Psychiatric: Mood, memory, affect and judgment normal.  Vitals reviewed.  Procedure:  Consent obtained.  Local anesthesia with 2% lidocaine.  Wound cleaned and explored.  Closed with 5-0 Ethilon #2 horizontal mattress and #1 SI.  Drsg placed Assessment and Plan :  Laceration of thumb, right, initial encounter - Plan: Tdap vaccine greater than or equal to 7yo IM, Apply dressing, doxycycline (VIBRA-TABS) 100 MG tablet  Need for Tdap vaccination   Pt is leaving for [place] tomorrow for 4 days for work.  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  I have sent her with a Rx for Doxy to start if the area gets red or there are signs of infection.  She is to keep it covered while in dirty environments.  Windell Hummingbird PA-C  [facility] 01/04/2016 10:02 AM

## 2016-01-04 NOTE — Patient Instructions (Addendum)
WOUND CARE Please return in 10 days to have your stitches/staples removed or sooner if you have concerns. Marland Kitchen Keep area clean and dry for 24 hours. Do not remove bandage, if applied. . After 24 hours, remove bandage and wash wound gently with mild soap and warm water. Reapply a new bandage after cleaning wound, if directed. . Continue daily cleansing with soap and water until stitches/staples are removed. . Do not apply any ointments or creams to the wound while stitches/staples are in place, as this may cause delayed healing. . Notify the office if you experience any of the following signs of infection: Swelling, redness, pus drainage, streaking, fever >101.0 F . Notify the office if you experience excessive bleeding that does not stop after 15-20 minutes of constant, firm pressure.      IF you received an x-ray today, you will receive an invoice from [facility]. Please contact [facility] at [phone] with questions or concerns regarding your invoice.   IF you received labwork today, you will receive an invoice from [employer]. Please contact [facility] at [phone] with questions or concerns regarding your invoice.   Our billing staff will not be able to assist you with questions regarding bills from these companies.  You will be contacted with the lab results as soon as they are available. The fastest way to get your results is to activate your My Chart account. Instructions are located on the last page of this paperwork. If you have not heard from [place] regarding the results in 2 weeks, please contact this office.

## 2017-05-11 ENCOUNTER — Encounter: Payer: Self-pay | Admitting: Internal Medicine

## 2018-09-05 ENCOUNTER — Encounter (HOSPITAL_COMMUNITY): Payer: Self-pay

## 2018-09-05 ENCOUNTER — Emergency Department (HOSPITAL_COMMUNITY): Payer: 59

## 2018-09-05 ENCOUNTER — Emergency Department (HOSPITAL_COMMUNITY)
Admission: EM | Admit: 2018-09-05 | Discharge: 2018-09-06 | Disposition: A | Discharge status: Home / Self Care | Payer: 59 | Attending: Emergency Medicine | Admitting: Emergency Medicine

## 2018-09-05 ENCOUNTER — Ambulatory Visit (HOSPITAL_COMMUNITY)
Admission: EM | Admit: 2018-09-05 | Discharge: 2018-09-05 | Disposition: A | Discharge status: Home / Self Care | Payer: 59 | Source: Home / Self Care

## 2018-09-05 DIAGNOSIS — R51 Headache: Secondary | ICD-10-CM | POA: Diagnosis not present

## 2018-09-05 DIAGNOSIS — R1032 Left lower quadrant pain: Secondary | ICD-10-CM | POA: Insufficient documentation

## 2018-09-05 DIAGNOSIS — R197 Diarrhea, unspecified: Secondary | ICD-10-CM | POA: Insufficient documentation

## 2018-09-05 DIAGNOSIS — R1110 Vomiting, unspecified: Secondary | ICD-10-CM

## 2018-09-05 DIAGNOSIS — R519 Headache, unspecified: Secondary | ICD-10-CM

## 2018-09-05 DIAGNOSIS — R109 Unspecified abdominal pain: Secondary | ICD-10-CM | POA: Diagnosis present

## 2018-09-05 LAB — COMPREHENSIVE METABOLIC PANEL
ALT: 30 U/L (ref 0–44)
AST: 21 U/L (ref 15–41)
Albumin: 3.9 g/dL (ref 3.5–5.0)
Alkaline Phosphatase: 71 U/L (ref 38–126)
Anion gap: 12 (ref 5–15)
BILIRUBIN TOTAL: 0.7 mg/dL (ref 0.3–1.2)
BUN: 8 mg/dL (ref 6–20)
CO2: 21 mmol/L — ABNORMAL LOW (ref 22–32)
CREATININE: 0.89 mg/dL (ref 0.44–1.00)
Calcium: 9.1 mg/dL (ref 8.9–10.3)
Chloride: 103 mmol/L (ref 98–111)
GFR calc non Af Amer: >60 mL/min (ref >60)
Glucose, Bld: 107 mg/dL — ABNORMAL HIGH (ref 70–99)
Potassium: 3.7 mmol/L (ref 3.5–5.1)
Sodium: 136 mmol/L (ref 135–145)
Total Protein: 7.1 g/dL (ref 6.5–8.1)

## 2018-09-05 LAB — CBC WITH DIFFERENTIAL/PLATELET
Abs Immature Granulocytes: 0.07 10*3/uL (ref 0.00–0.07)
BASOS ABS: 0.1 10*3/uL (ref 0.0–0.1)
Basophils Relative: 1 %
EOS PCT: 0 %
Eosinophils Absolute: 0 10*3/uL (ref 0.0–0.5)
HCT: 41.2 % (ref 36.0–46.0)
HEMOGLOBIN: 13.2 g/dL (ref 12.0–15.0)
Immature Granulocytes: 1 %
LYMPHS PCT: 19 %
Lymphs Abs: 2.8 10*3/uL (ref 0.7–4.0)
MCH: 29.5 pg (ref 26.0–34.0)
MCHC: 32 g/dL (ref 30.0–36.0)
MCV: 92.2 fL (ref 80.0–100.0)
Monocytes Absolute: 1 10*3/uL (ref 0.1–1.0)
Monocytes Relative: 7 %
NEUTROS PCT: 72 %
NRBC: 0 % (ref 0.0–0.2)
Neutro Abs: 10.8 10*3/uL — ABNORMAL HIGH (ref 1.7–7.7)
PLATELETS: 278 10*3/uL (ref 150–400)
RBC: 4.47 MIL/uL (ref 3.87–5.11)
RDW: 12.5 % (ref 11.5–15.5)
WBC: 14.8 10*3/uL — AB (ref 4.0–10.5)

## 2018-09-05 LAB — LIPASE, BLOOD: LIPASE: 25 U/L (ref 11–51)

## 2018-09-05 LAB — I-STAT BETA HCG BLOOD, ED (MC, WL, AP ONLY): I-stat hCG, quantitative: <5 m[IU]/mL (ref <5)

## 2018-09-05 MED ORDER — ONDANSETRON 4 MG PO TBDP: 4.0000 mg | ORAL_TABLET | Freq: Three times a day (TID) | ORAL | 0 refills | Status: DC | PRN

## 2018-09-05 MED ORDER — METOCLOPRAMIDE HCL 5 MG/ML IJ SOLN
10.0000 mg | Freq: Once | INTRAMUSCULAR | Status: AC
  Administered 2018-09-05: 10 mg via INTRAVENOUS
  Filled 2018-09-05: qty 2

## 2018-09-05 MED ORDER — SODIUM CHLORIDE 0.9 % IV BOLUS
1000.0000 mL | Freq: Once | INTRAVENOUS | Status: AC
  Administered 2018-09-05: 1000 mL via INTRAVENOUS

## 2018-09-05 MED ORDER — KETOROLAC TROMETHAMINE 30 MG/ML IJ SOLN
15.0000 mg | Freq: Once | INTRAMUSCULAR | Status: AC
  Administered 2018-09-05: 15 mg via INTRAVENOUS
  Filled 2018-09-05: qty 1

## 2018-09-05 MED ORDER — ONDANSETRON HCL 4 MG/2ML IJ SOLN
4.0000 mg | Freq: Once | INTRAMUSCULAR | Status: AC
  Administered 2018-09-05: 4 mg via INTRAVENOUS
  Filled 2018-09-05: qty 2

## 2018-09-05 MED ORDER — IOHEXOL 300 MG/ML  SOLN
100.0000 mL | Freq: Once | INTRAMUSCULAR | Status: AC | PRN
  Administered 2018-09-05: 100 mL via INTRAVENOUS
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
## 2018-09-05 NOTE — ED Triage Notes (Addendum)
Pt present vomiting, diarrhea and headache that started this morning.  Pt states that she has been vomiting all morning non stop and that the vomiting is yellow.

## 2018-09-05 NOTE — ED Triage Notes (Signed)
Pt endorses headache since this morning, has hx of migraines, this feels worse with n/v. VSS

## 2018-09-05 NOTE — ED Notes (Addendum)
Pt reports feeling progressively worse while waiting for the doctor to see her. Per Dr. Meda Coffee patient should go to the ER if she is having increased emesis along with headache and abdominal pain. Pt taken to the ER by family member

## 2018-09-05 NOTE — Discharge Instructions (Signed)
Home to rest, take Zofran as needed as prescribed for nausea and vomiting. Drink hydrating fluids such as G2 or Gatorade. Follow-up with your doctor, return to ER for new or worsening symptoms.

## 2019-06-08 IMAGING — CT CT HEAD W/O CM
4 series · 16 of 47 positions shown, 18 images · non-contrast
Comparison: 12/28/2008.

CLINICAL DATA: Severe headache since this morning with nausea and
vomiting.

EXAM:
CT HEAD WITHOUT CONTRAST
TECHNIQUE: Contiguous axial images were obtained from the base of the skull
through the vertex without intravenous contrast.

[Series 3: head without · axial · non-contrast · 0.41mm/px · z∈[-172,-52]mm · 7 of 33 slices shown, 9 images]
[im 5/33  brain]
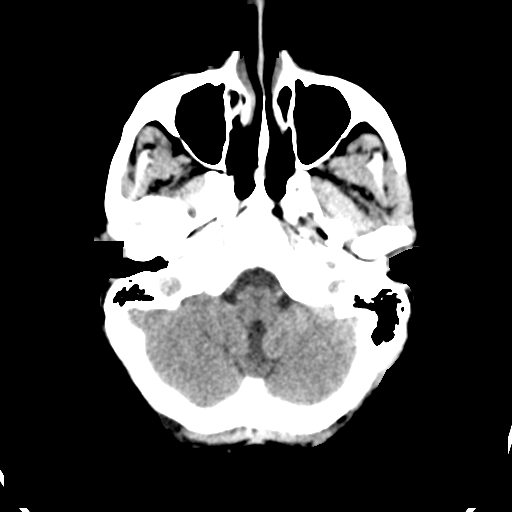
[im 5/33  bone]
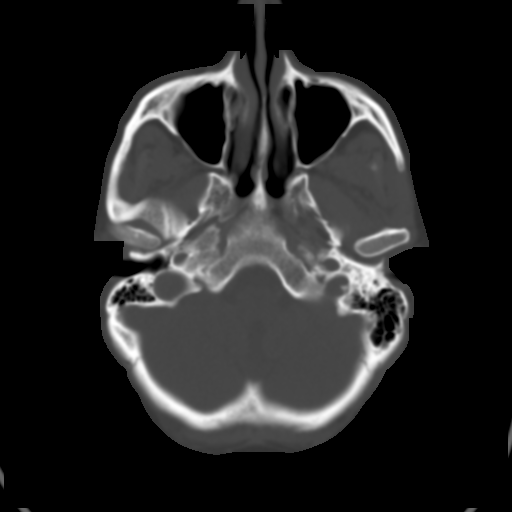
[im 9/33  brain]
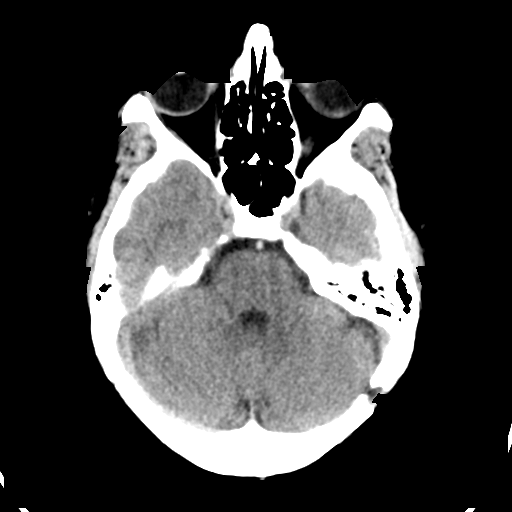
[im 13/33  brain]
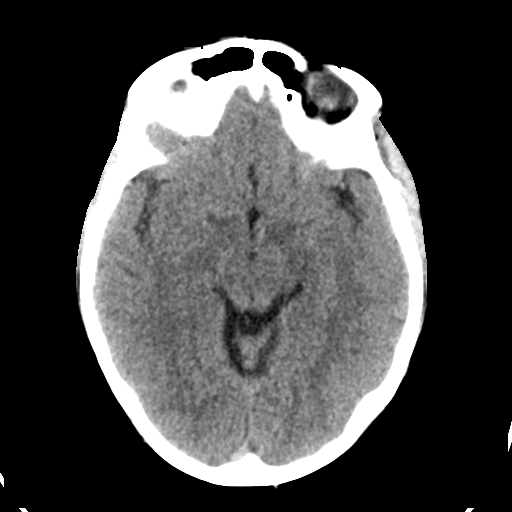
[im 17/33  brain]
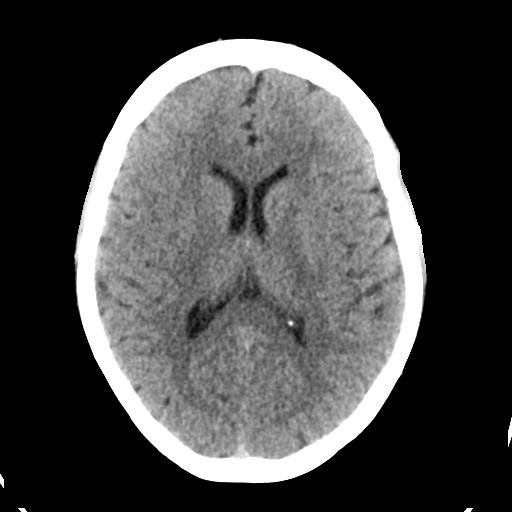
[im 21/33  brain]
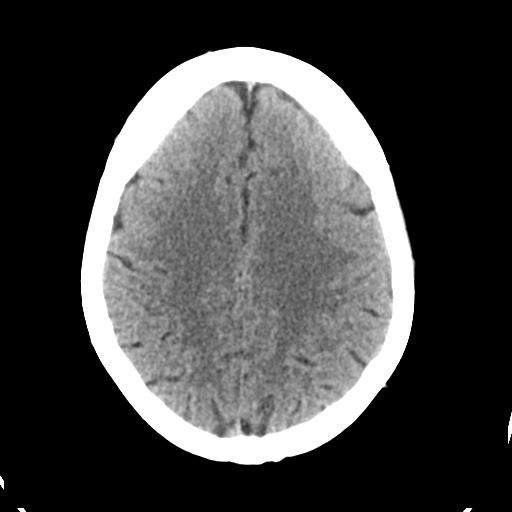
[im 21/33  bone]
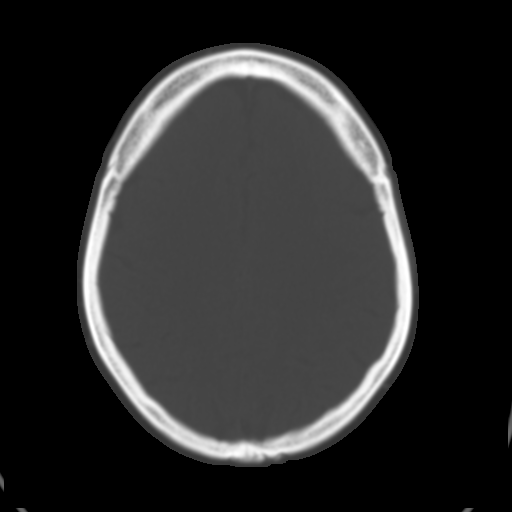
[im 25/33  brain]
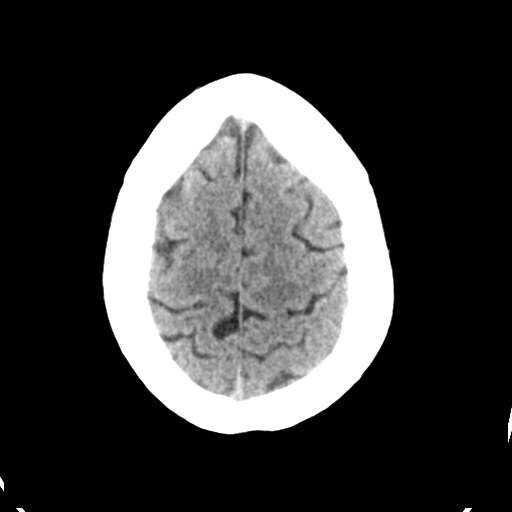
[im 29/33  brain]
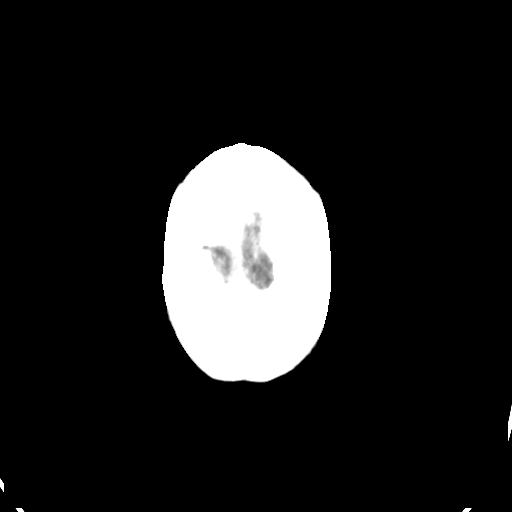

[Series 4: head bone · axial · 0.41mm/px · z∈[-176,-144]mm · 3 of 81 slices shown]
[im 9/81  bone]
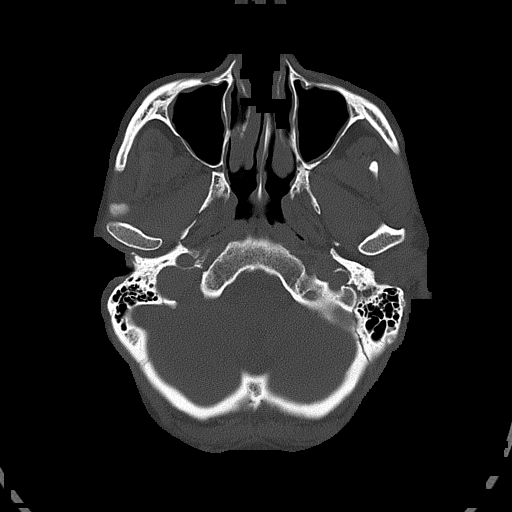
[im 17/81  bone]
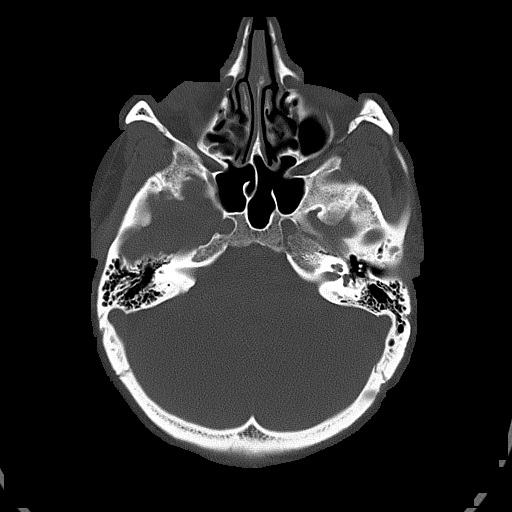
[im 25/81  bone]
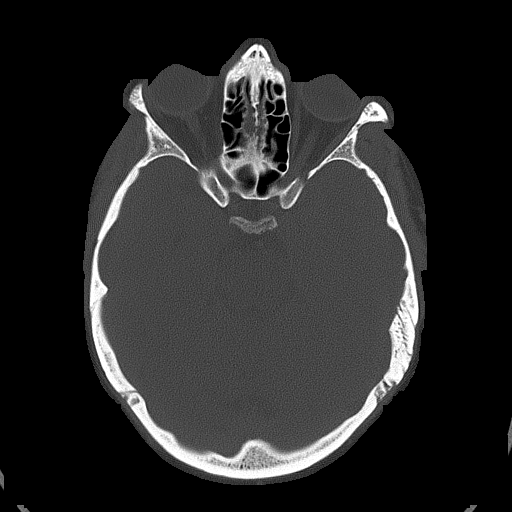

[Series 5: head without cor · coronal · non-contrast · 0.31mm/px · 3 of 67 slices shown]
[im 23/67  brain]
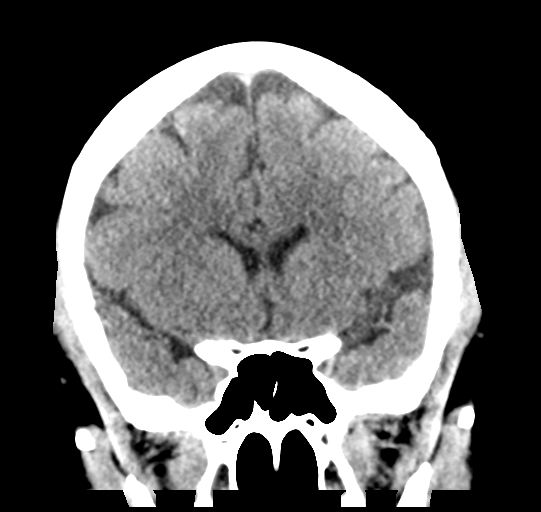
[im 30/67  brain]
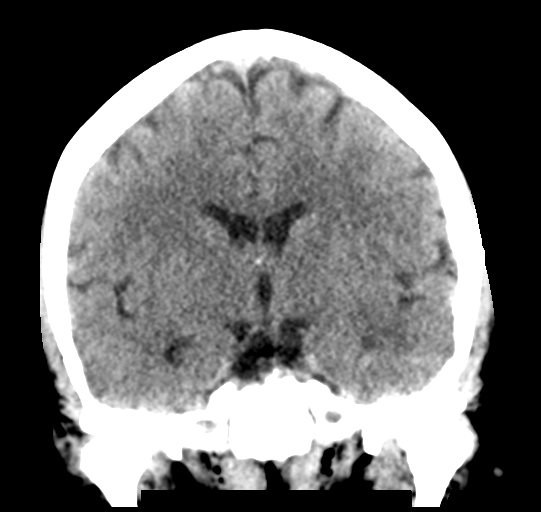
[im 37/67  brain]
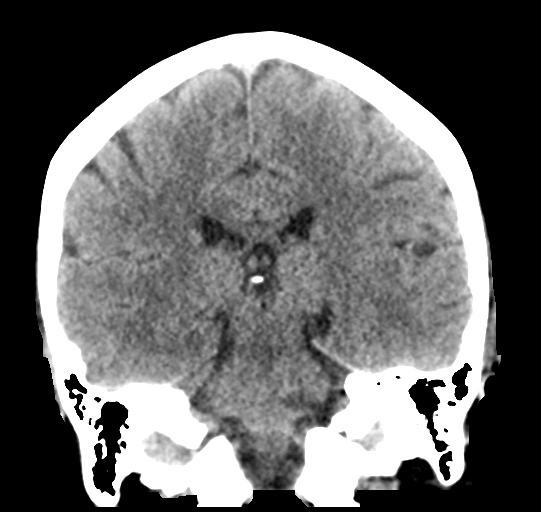

[Series 6: head without sag · sagittal · non-contrast · 0.31mm/px · 3 of 58 slices shown]
[im 20/58  brain]
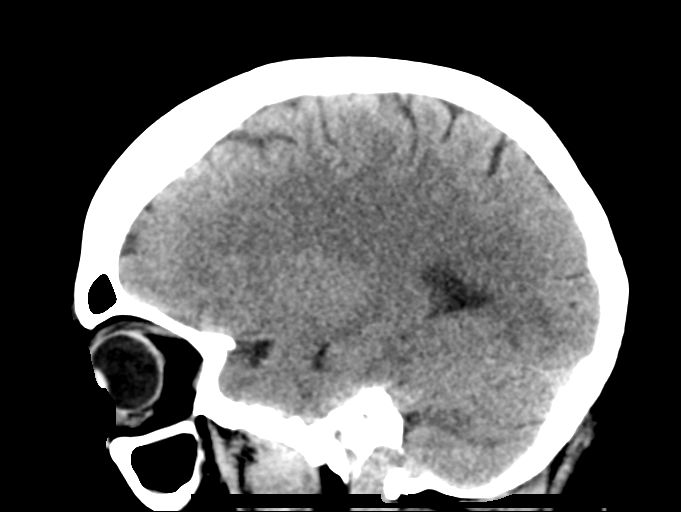
[im 29/58  brain]
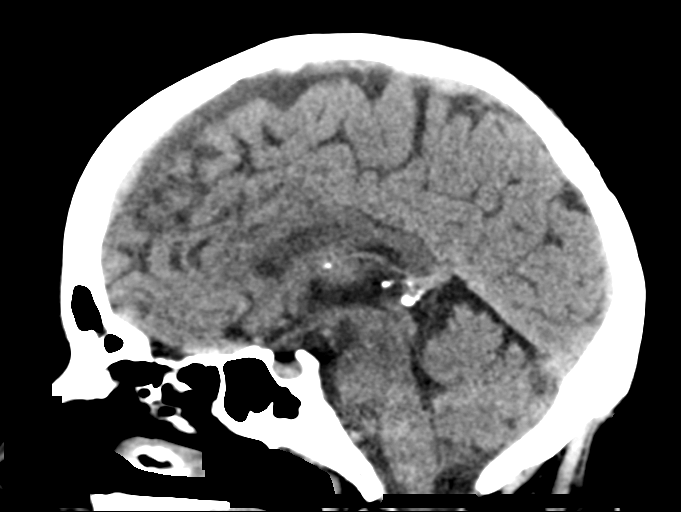
[im 39/58  brain]
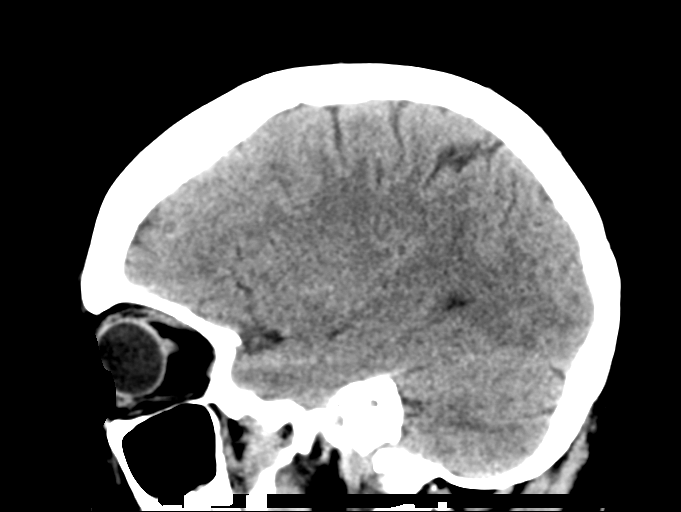

[16 of 47 positions shown; findings below may reference images not displayed]

FINDINGS: Brain: Normal appearing cerebral hemispheres and posterior fossa
structures. Normal size and position of the ventricles. No
intracranial hemorrhage, mass lesion or CT evidence of acute
infarction.

Vascular: No hyperdense vessel or unexpected calcification.

Skull: Stable oval lucent lesion in the left occipital bone
measuring 70 Hounsfield units, compatible with a venous [place].

Sinuses/Orbits: Moderate right frontal and right maxillary sinus
mucosal thickening and mild left maxillary sinus mucosal thickening.
Unremarkable orbits.

Other: None.
IMPRESSION: 1. No intracranial abnormality.
2. Chronic sinusitis.

## 2019-08-13 ENCOUNTER — Telehealth: Payer: Self-pay | Admitting: *Deleted

## 2019-08-13 NOTE — Telephone Encounter (Signed)
Copied from [place] [id number]. Topic: General - Call Back - No Documentation >> Aug 13, 2019  2:49 PM Erick Blinks wrote: Reason for CRM: pt's mother Jodi Geralds is a pt with Dr. Regis Bill, the pt wants to re-establish with Dr. 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 Regis Bill has treated the patient for it. Please advise, pt would like to resume with PCP. Best contact: [phone]

## 2019-08-13 NOTE — Telephone Encounter (Signed)
Please advise 

## 2019-09-26 ENCOUNTER — Encounter: Payer: Self-pay | Admitting: *Deleted

## 2019-09-26 NOTE — Telephone Encounter (Signed)
Patient informed. 

## 2019-09-26 NOTE — Telephone Encounter (Signed)
So sorry this is an extremely late message response    Not taking new patients    And maybe  She would be best seen by one of our other providers in the building   BUT if cannot get in with one of them I can see her in the interim .

## 2020-01-09 ENCOUNTER — Encounter: Payer: Self-pay | Admitting: Family Medicine

## 2020-01-09 ENCOUNTER — Other Ambulatory Visit: Payer: Self-pay

## 2020-01-09 ENCOUNTER — Ambulatory Visit: Payer: 59 | Admitting: Family Medicine

## 2020-01-09 VITALS — BP 152/88 | HR 98 | Temp 98.6°F | Ht 66.0 in | Wt 182.2 lb

## 2020-01-09 DIAGNOSIS — Z13220 Encounter for screening for lipoid disorders: Secondary | ICD-10-CM

## 2020-01-09 DIAGNOSIS — R748 Abnormal levels of other serum enzymes: Secondary | ICD-10-CM | POA: Diagnosis not present

## 2020-01-09 DIAGNOSIS — R030 Elevated blood-pressure reading, without diagnosis of hypertension: Secondary | ICD-10-CM | POA: Diagnosis not present

## 2020-01-09 NOTE — Patient Instructions (Addendum)
Please check pressures daily and record. Let me know results when I check in with you. ifyou are routinely getting pressures 135/85 or higher call me sooner.      DASH Eating Plan DASH stands for "Dietary Approaches to Stop Hypertension." The DASH eating plan is a healthy eating plan that has been shown to reduce high blood pressure (hypertension). It may also reduce your risk for type 2 diabetes, heart disease, and stroke. The DASH eating plan may also help with weight loss. What are tips for following this plan?  General guidelines  Avoid eating more than 2,300 mg (milligrams) of salt (sodium) a day. If you have hypertension, you may need to reduce your sodium intake to 1,500 mg a day.  Limit alcohol intake to no more than 1 drink a day for nonpregnant women and 2 drinks a day for men. One drink equals 12 oz of beer, 5 oz of wine, or 1 oz of hard liquor.  Work with your health care provider to maintain a healthy body weight or to lose weight. Ask what an ideal weight is for you.  Get at least 30 minutes of exercise that causes your heart to beat faster (aerobic exercise) most days of the week. Activities may include walking, swimming, or biking.  Work with your health care provider or diet and nutrition specialist (dietitian) to adjust your eating plan to your individual calorie needs. Reading food labels   Check food labels for the amount of sodium per serving. Choose foods with less than 5 percent of the Daily Value of sodium. Generally, foods with less than 300 mg of sodium per serving fit into this eating plan.  To find whole grains, look for the word "whole" as the first word in the ingredient list. Shopping  Buy products labeled as "low-sodium" or "no salt added."  Buy fresh foods. Avoid canned foods and premade or frozen meals. Cooking  Avoid adding salt when cooking. Use salt-free seasonings or herbs instead of table salt or sea salt. Check with your health care provider  or pharmacist before using salt substitutes.  Do not fry foods. Cook foods using healthy methods such as baking, boiling, grilling, and broiling instead.  Cook with heart-healthy oils, such as olive, canola, soybean, or sunflower oil. Meal planning  Eat a balanced diet that includes: ? 5 or more servings of fruits and vegetables each day. At each meal, try to fill half of your plate with fruits and vegetables. ? Up to 6-8 servings of whole grains each day. ? Less than 6 oz of lean meat, poultry, or fish each day. A 3-oz serving of meat is about the same size as a deck of cards. One egg equals 1 oz. ? 2 servings of low-fat dairy each day. ? A serving of nuts, seeds, or beans 5 times each week. ? Heart-healthy fats. Healthy fats called Omega-3 fatty acids are found in foods such as flaxseeds and coldwater fish, like sardines, salmon, and mackerel.  Limit how much you eat of the following: ? Canned or prepackaged foods. ? Food that is high in trans fat, such as fried foods. ? Food that is high in saturated fat, such as fatty meat. ? Sweets, desserts, sugary drinks, and other foods with added sugar. ? Full-fat dairy products.  Do not salt foods before eating.  Try to eat at least 2 vegetarian meals each week.  Eat more home-cooked food and less restaurant, buffet, and fast food.  When eating at a restaurant,  ask that your food be prepared with less salt or no salt, if possible. What foods are recommended? The items listed may not be a complete list. Talk with your dietitian about what dietary choices are best for you. Grains Whole-grain or whole-wheat bread. Whole-grain or whole-wheat pasta. Brown rice. [place]. Bulgur. Whole-grain and low-sodium cereals. Pita bread. Low-fat, low-sodium crackers. Whole-wheat flour tortillas. Vegetables Fresh or frozen vegetables (raw, steamed, roasted, or grilled). Low-sodium or reduced-sodium tomato and vegetable juice. Low-sodium or  reduced-sodium tomato sauce and tomato paste. Low-sodium or reduced-sodium canned vegetables. Fruits All fresh, dried, or frozen fruit. Canned fruit in natural juice (without added sugar). Meat and other protein foods Skinless chicken or [place]. Ground chicken or [place]. Pork with fat trimmed off. Fish and seafood. Egg whites. Dried beans, peas, or lentils. Unsalted nuts, nut butters, and seeds. Unsalted canned beans. Lean cuts of beef with fat trimmed off. Low-sodium, lean deli meat. Dairy Low-fat (1%) or fat-free (skim) milk. Fat-free, low-fat, or reduced-fat cheeses. Nonfat, low-sodium ricotta or cottage cheese. Low-fat or nonfat yogurt. Low-fat, low-sodium cheese. Fats and oils Soft margarine without trans fats. Vegetable oil. Low-fat, reduced-fat, or light mayonnaise and salad dressings (reduced-sodium). Canola, safflower, olive, soybean, and sunflower oils. Avocado. Seasoning and other foods Herbs. Spices. Seasoning mixes without salt. Unsalted popcorn and pretzels. Fat-free sweets. What foods are not recommended? The items listed may not be a complete list. Talk with your dietitian about what dietary choices are best for you. Grains Baked goods made with fat, such as croissants, muffins, or some breads. Dry pasta or rice meal packs. Vegetables Creamed or fried vegetables. Vegetables in a cheese sauce. Regular canned vegetables (not low-sodium or reduced-sodium). Regular canned tomato sauce and paste (not low-sodium or reduced-sodium). Regular tomato and vegetable juice (not low-sodium or reduced-sodium). Angie Fava. Olives. Fruits Canned fruit in a light or heavy syrup. Fried fruit. Fruit in cream or butter sauce. Meat and other protein foods Fatty cuts of meat. Ribs. Fried meat. Berniece Salines. Sausage. Bologna and other processed lunch meats. Salami. Fatback. Hotdogs. Bratwurst. Salted nuts and seeds. Canned beans with added salt. Canned or smoked fish. Whole eggs or egg yolks. Chicken or [place]  with skin. Dairy Whole or 2% milk, cream, and half-and-half. Whole or full-fat cream cheese. Whole-fat or sweetened yogurt. Full-fat cheese. Nondairy creamers. Whipped toppings. Processed cheese and cheese spreads. Fats and oils Butter. Stick margarine. Lard. Shortening. Ghee. Bacon fat. Tropical oils, such as coconut, palm kernel, or palm oil. Seasoning and other foods Salted popcorn and pretzels. Onion salt, garlic salt, seasoned salt, table salt, and sea salt. Worcestershire sauce. Tartar sauce. Barbecue sauce. Teriyaki sauce. Soy sauce, including reduced-sodium. Steak sauce. Canned and packaged gravies. Fish sauce. Oyster sauce. Cocktail sauce. Horseradish that you find on the shelf. Ketchup. Mustard. Meat flavorings and tenderizers. Bouillon cubes. Hot sauce and Tabasco sauce. Premade or packaged marinades. Premade or packaged taco seasonings. Relishes. Regular salad dressings. Where to find more information:  National Heart, Lung, and [place]: https://Jedaiah Rathbun-eaton.com/  American Heart Association: www.heart.org Summary  The DASH eating plan is a healthy eating plan that has been shown to reduce high blood pressure (hypertension). It may also reduce your risk for type 2 diabetes, heart disease, and stroke.  With the DASH eating plan, you should limit salt (sodium) intake to 2,300 mg a day. If you have hypertension, you may need to reduce your sodium intake to 1,500 mg a day.  When on the DASH eating plan, aim to eat more fresh  fruits and vegetables, whole grains, lean proteins, low-fat dairy, and heart-healthy fats.  Work with your health care provider or diet and nutrition specialist (dietitian) to adjust your eating plan to your individual calorie needs. This information is not intended to replace advice given to you by your health care provider. Make sure you discuss any questions you have with your health care provider. Document Revised: 07/20/2017 Document Reviewed: 07/31/2016  Elsevier Patient Education  [place].  Dyslipidemia Dyslipidemia is an imbalance of waxy, fat-like substances (lipids) in the blood. The body needs lipids in small amounts. Dyslipidemia often involves a high level of cholesterol or triglycerides, which are types of lipids. Common forms of dyslipidemia include:  High levels of LDL cholesterol. LDL is the type of cholesterol that causes fatty deposits (plaques) to build up in the blood vessels that carry blood away from your heart (arteries).  Low levels of HDL cholesterol. HDL cholesterol is the type of cholesterol that protects against heart disease. High levels of HDL remove the LDL buildup from arteries.  High levels of triglycerides. Triglycerides are a fatty substance in the blood that is linked to a buildup of plaques in the arteries. What are the causes? Primary dyslipidemia is caused by changes (mutations) in genes that are passed down through families (inherited). These mutations cause several types of dyslipidemia. Secondary dyslipidemia is caused by lifestyle choices and diseases that lead to dyslipidemia, such as:  Eating a diet that is high in animal fat.  Not getting enough exercise.  Having diabetes, kidney disease, liver disease, or thyroid disease.  Drinking large amounts of alcohol.  Using certain medicines. What increases the risk? You are more likely to develop this condition if you are an older man or if you are a woman who has gone through menopause. Other risk factors include:  Having a family history of dyslipidemia.  Taking certain medicines, including birth control pills, steroids, some diuretics, and beta-blockers.  Smoking cigarettes.  Eating a high-fat diet.  Having certain medical conditions such as diabetes, polycystic ovary syndrome (PCOS), kidney disease, liver disease, or hypothyroidism.  Not exercising regularly.  Being overweight or obese with too much belly fat. What are the signs  or symptoms? In most cases, dyslipidemia does not usually cause any symptoms. In severe cases, very high lipid levels can cause:  Fatty bumps under the skin (xanthomas).  White or gray ring around the black center (pupil) of the eye. Very high triglyceride levels can cause inflammation of the pancreas (pancreatitis). How is this diagnosed? Your health care provider may diagnose dyslipidemia based on a routine blood test (fasting blood test). Because most people do not have symptoms of the condition, this blood testing (lipid profile) is done on adults age 53 and older and is repeated every 5 years. This test checks:  Total cholesterol. This measures the total amount of cholesterol in your blood, including LDL cholesterol, HDL cholesterol, and triglycerides. A healthy number is below 200.  LDL cholesterol. The target number for LDL cholesterol is different for each person, depending on individual risk factors. Ask your health care provider what your LDL cholesterol should be.  HDL cholesterol. An HDL level of 60 or higher is best because it helps to protect against heart disease. A number below 84 for men or below 63 for women increases the risk for heart disease.  Triglycerides. A healthy triglyceride number is below 150. If your lipid profile is abnormal, your health care provider may do other blood tests. How  is this treated? Treatment depends on the type of dyslipidemia that you have and your other risk factors for heart disease and stroke. Your health care provider will have a target range for your lipid levels based on this information. For many people, this condition may be treated by lifestyle changes, such as diet and exercise. Your health care provider may recommend that you:  Get regular exercise.  Make changes to your diet.  Quit smoking if you smoke. If diet changes and exercise do not help you reach your goals, your health care provider may also prescribe medicine to lower  lipids. The most commonly prescribed type of medicine lowers your LDL cholesterol (statin drug). If you have a high triglyceride level, your provider may prescribe another type of drug (fibrate) or an omega-3 fish oil supplement, or both. Follow these instructions at home:  Eating and drinking  Follow instructions from your health care provider or dietitian about eating or drinking restrictions.  Eat a healthy diet as told by your health care provider. This can help you reach and maintain a healthy weight, lower your LDL cholesterol, and raise your HDL cholesterol. This may include: ? Limiting your calories, if you are overweight. ? Eating more fruits, vegetables, whole grains, fish, and lean meats. ? Limiting saturated fat, trans fat, and cholesterol.  If you drink alcohol: ? Limit how much you use. ? Be aware of how much alcohol is in your drink. In the [place] one drink equals one 12 oz bottle of beer (355 mL), one 5 oz glass of wine (148 mL), or one 1 oz glass of hard liquor (44 mL).  Do not drink alcohol if: ? Your health care provider tells you not to drink. ? You are pregnant, may be pregnant, or are planning to become pregnant. Activity  Get regular exercise. Start an exercise and strength training program as told by your health care provider. Ask your health care provider what activities are safe for you. Your health care provider may recommend: ? 30 minutes of aerobic activity 4-6 days a week. Brisk walking is an example of aerobic activity. ? Strength training 2 days a week. General instructions  Do not use any products that contain nicotine or tobacco, such as cigarettes, e-cigarettes, and chewing tobacco. If you need help quitting, ask your health care provider.  Take over-the-counter and prescription medicines only as told by your health care provider. This includes supplements.  Keep all follow-up visits as told by your health care provider. Contact a health care provider  if:  You are: ? Having trouble sticking to your exercise or diet plan. ? Struggling to quit smoking or control your use of alcohol. Summary  Dyslipidemia often involves a high level of cholesterol or triglycerides, which are types of lipids.  Treatment depends on the type of dyslipidemia that you have and your other risk factors for heart disease and stroke.  For many people, treatment starts with lifestyle changes, such as diet and exercise.  Your health care provider may prescribe medicine to lower lipids. This information is not intended to replace advice given to you by your health care provider. Make sure you discuss any questions you have with your health care provider. Document Revised: 04/01/2018 Document Reviewed: 03/08/2018 Elsevier Patient Education  [place].
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
## 2020-01-13 ENCOUNTER — Encounter: Payer: Self-pay | Admitting: Family Medicine

## 2020-02-11 ENCOUNTER — Other Ambulatory Visit: Payer: Self-pay

## 2020-02-12 ENCOUNTER — Other Ambulatory Visit (INDEPENDENT_AMBULATORY_CARE_PROVIDER_SITE_OTHER): Payer: 59

## 2020-02-12 DIAGNOSIS — Z13220 Encounter for screening for lipoid disorders: Secondary | ICD-10-CM | POA: Diagnosis not present

## 2020-02-12 DIAGNOSIS — R030 Elevated blood-pressure reading, without diagnosis of hypertension: Secondary | ICD-10-CM

## 2020-02-12 DIAGNOSIS — R748 Abnormal levels of other serum enzymes: Secondary | ICD-10-CM

## 2020-02-12 LAB — COMPREHENSIVE METABOLIC PANEL
ALT: 26 U/L (ref 0–35)
AST: 18 U/L (ref 0–37)
Albumin: 4.2 g/dL (ref 3.5–5.2)
Alkaline Phosphatase: 75 U/L (ref 39–117)
BUN: 9 mg/dL (ref 6–23)
CO2: 26 mEq/L (ref 19–32)
Calcium: 9.4 mg/dL (ref 8.4–10.5)
Chloride: 102 mEq/L (ref 96–112)
Creatinine, Ser: 0.82 mg/dL (ref 0.40–1.20)
GFR: 75.6 mL/min (ref >60.00)
Glucose, Bld: 100 mg/dL — ABNORMAL HIGH (ref 70–99)
Potassium: 4.7 mEq/L (ref 3.5–5.1)
Sodium: 133 mEq/L — ABNORMAL LOW (ref 135–145)
Total Bilirubin: 0.5 mg/dL (ref 0.2–1.2)
Total Protein: 6.7 g/dL (ref 6.0–8.3)

## 2020-02-12 LAB — CBC WITH DIFFERENTIAL/PLATELET
Basophils Absolute: 0.1 10*3/uL (ref 0.0–0.1)
Basophils Relative: 0.9 % (ref 0.0–3.0)
Eosinophils Absolute: 0.1 10*3/uL (ref 0.0–0.7)
Eosinophils Relative: 1.8 % (ref 0.0–5.0)
HCT: 39.9 % (ref 36.0–46.0)
Hemoglobin: 13.4 g/dL (ref 12.0–15.0)
Lymphocytes Relative: 23.6 % (ref 12.0–46.0)
Lymphs Abs: 1.9 10*3/uL (ref 0.7–4.0)
MCHC: 33.6 g/dL (ref 30.0–36.0)
MCV: 92.4 fl (ref 78.0–100.0)
Monocytes Absolute: 0.7 10*3/uL (ref 0.1–1.0)
Monocytes Relative: 8.3 % (ref 3.0–12.0)
Neutro Abs: 5.4 10*3/uL (ref 1.4–7.7)
Neutrophils Relative %: 65.4 % (ref 43.0–77.0)
Platelets: 253 10*3/uL (ref 150.0–400.0)
RBC: 4.32 Mil/uL (ref 3.87–5.11)
RDW: 13 % (ref 11.5–15.5)
WBC: 8.2 10*3/uL (ref 4.0–10.5)

## 2020-02-12 LAB — LIPID PANEL
Cholesterol: 234 mg/dL — ABNORMAL HIGH (ref 0–200)
HDL: 64.8 mg/dL (ref >39.00)
LDL Cholesterol: 146 mg/dL — ABNORMAL HIGH (ref 0–99)
NonHDL: 169
Total CHOL/HDL Ratio: 4
Triglycerides: 114 mg/dL (ref 0.0–149.0)
VLDL: 22.8 mg/dL (ref 0.0–40.0)

## 2020-05-17 LAB — HM MAMMOGRAPHY

## 2020-05-18 ENCOUNTER — Encounter: Payer: Self-pay | Admitting: Family Medicine

## 2020-05-21 ENCOUNTER — Other Ambulatory Visit: Payer: Self-pay

## 2020-05-21 ENCOUNTER — Encounter: Payer: Self-pay | Admitting: Family Medicine

## 2020-05-21 ENCOUNTER — Ambulatory Visit (INDEPENDENT_AMBULATORY_CARE_PROVIDER_SITE_OTHER): Payer: 59 | Admitting: Family Medicine

## 2020-05-21 VITALS — BP 140/80 | HR 82 | Temp 98.0°F | Ht 66.0 in | Wt 179.5 lb

## 2020-05-21 DIAGNOSIS — Z0000 Encounter for general adult medical examination without abnormal findings: Secondary | ICD-10-CM | POA: Diagnosis not present

## 2020-05-21 DIAGNOSIS — R030 Elevated blood-pressure reading, without diagnosis of hypertension: Secondary | ICD-10-CM
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
## 2020-05-21 NOTE — Patient Instructions (Signed)
See how more happy but healthy eating goes for next few months and let me know how blood pressures are looking. Check pressures at different times in the day.

## 2020-08-23 ENCOUNTER — Encounter: Payer: Self-pay | Admitting: Family Medicine

## 2020-08-23 ENCOUNTER — Ambulatory Visit: Payer: 59 | Admitting: Family Medicine

## 2020-08-23 ENCOUNTER — Other Ambulatory Visit: Payer: Self-pay

## 2020-08-23 VITALS — BP 128/90 | HR 82 | Temp 98.3°F | Ht 66.0 in | Wt 180.4 lb

## 2020-08-23 DIAGNOSIS — I10 Essential (primary) hypertension: Secondary | ICD-10-CM

## 2020-08-23 MED ORDER — LISINOPRIL 5 MG PO TABS: 5.0000 mg | ORAL_TABLET | Freq: Every day | ORAL | 1 refills | Status: DC

## 2020-08-23 NOTE — Patient Instructions (Addendum)
Think about dietary options - but if you feel that Atkins will work; we can have you try this for a few months.   Start the lisinopril daily. Continue to monitor your blood pressure.

## 2020-08-23 NOTE — Progress Notes (Signed)
  Catherine Barry DOB: 24-Oct-1975 Encounter date: 08/23/2020  This is a 45 y.o. female who presents with Chief Complaint  Patient presents with  . Follow-up    History of present illness: Has been checking bp at home. Was 124/82 a few minutes ago. At home she is getting up to 130's; but usually 86-76 diastolic. Has seen systolic down in 195-093 regularly. Can feel it spike with stress and certain foods. When she started low sodium diet, she could really tell difference after eating sodium.   My recheck in office was 142/90. Her recheck on wrist cuff in office after mine was: 132/88. Her recheck was 148/98.  Has been very successful with weight loss on Atkins in past.   Allergies  Allergen Reactions  . Codeine Other (See Comments)    Heart racing  . Penicillins Rash    Did it involve swelling of the face/tongue/throat, SOB, or low BP? No Did it involve sudden or severe rash/hives, skin peeling, or any reaction on the inside of your mouth or nose? Yes Did you need to seek medical attention at a hospital or doctor's office? Yes When did it last happen?45 y.o. If all above answers are "NO", may proceed with cephalosporin use.    Current Meds  Medication Sig  . lisinopril (ZESTRIL) 5 MG tablet Take 1 tablet (5 mg total) by mouth daily.    Review of Systems  Constitutional: Negative for chills, fatigue and fever.  Respiratory: Negative for cough, chest tightness, shortness of breath and wheezing.   Cardiovascular: Negative for chest pain, palpitations and leg swelling.    Objective:  BP 128/90 (BP Location: Left Arm, Patient Position: Sitting, Cuff Size: Normal)   Pulse 82   Temp 98.3 F (36.8 C) (Oral)   Ht 5' 6"  (1.676 m)   Wt 180 lb 6.4 oz (81.8 kg)   BMI 29.12 kg/m   Weight: 180 lb 6.4 oz (81.8 kg)   BP Readings from Last 3 Encounters:  08/23/20 128/90  05/21/20 140/80  01/09/20 (!) 152/88   Wt Readings from Last 3 Encounters:  08/23/20 180 lb 6.4 oz (81.8  kg)  05/21/20 179 lb 8 oz (81.4 kg)  01/09/20 182 lb 3.2 oz (82.6 kg)    Physical Exam Constitutional:      General: She is not in acute distress.    Appearance: She is well-developed.  Cardiovascular:     Rate and Rhythm: Normal rate and regular rhythm.     Heart sounds: Normal heart sounds. No murmur heard. No friction rub.  Pulmonary:     Effort: Pulmonary effort is normal. No respiratory distress.     Breath sounds: Normal breath sounds. No wheezing or rales.  Musculoskeletal:     Right lower leg: No edema.     Left lower leg: No edema.  Neurological:     Mental Status: She is alert and oriented to person, place, and time.  Psychiatric:        Behavior: Behavior normal.     Assessment/Plan  1. Hypertension, unspecified type Blood pressures been slightly higher than goal.  Recommend starting lisinopril 5 mg daily. Discussed new medication(s) today with patient. Discussed potential side effects and patient verbalized understanding.  - lisinopril (ZESTRIL) 5 MG tablet; Take 1 tablet (5 mg total) by mouth daily.  Dispense: 90 tablet; Refill: 1   Return in about 3 months (around 11/21/2020) for Chronic condition visit.    Micheline Rough, MD

## 2020-12-06 ENCOUNTER — Encounter: Payer: Self-pay | Admitting: Family Medicine
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
## 2020-12-07 ENCOUNTER — Other Ambulatory Visit: Payer: Self-pay

## 2020-12-07 ENCOUNTER — Ambulatory Visit: Payer: 59 | Admitting: Internal Medicine

## 2020-12-07 ENCOUNTER — Encounter: Payer: Self-pay | Admitting: Internal Medicine

## 2020-12-07 VITALS — BP 150/80 | HR 116 | Temp 98.4°F | Ht 66.0 in | Wt 181.4 lb

## 2020-12-07 DIAGNOSIS — Z79899 Other long term (current) drug therapy: Secondary | ICD-10-CM | POA: Diagnosis not present

## 2020-12-07 DIAGNOSIS — I10 Essential (primary) hypertension: Secondary | ICD-10-CM

## 2020-12-07 DIAGNOSIS — F4329 Adjustment disorder with other symptoms: Secondary | ICD-10-CM | POA: Diagnosis not present

## 2020-12-07 MED ORDER — TRAZODONE HCL 50 MG PO TABS: 25.0000 mg | ORAL_TABLET | Freq: Every evening | ORAL | 1 refills | Status: DC | PRN

## 2020-12-07 NOTE — Patient Instructions (Signed)
Nix the alcohol can increase  BP over time. Stay hydrated with water .  Try trazadone at night to see if can help sleep some.  Walk twice a day 15 minutes or more   Daytime  Am and afternoon.   Counseling as we discussed  [email or web address]   If bp remains elevated can increase lisinopril   To 10 mg per day .   Keep appt with dr Raliegh Ip   Can check in with [place] in about 2 weeks .

## 2021-01-12 ENCOUNTER — Other Ambulatory Visit: Payer: Self-pay

## 2021-01-12 ENCOUNTER — Ambulatory Visit: Payer: 59 | Admitting: Family Medicine

## 2021-01-12 ENCOUNTER — Encounter: Payer: Self-pay | Admitting: Family Medicine

## 2021-01-12 VITALS — BP 140/90 | HR 98 | Temp 98.5°F | Ht 66.0 in | Wt 181.9 lb

## 2021-01-12 DIAGNOSIS — E785 Hyperlipidemia, unspecified: Secondary | ICD-10-CM | POA: Diagnosis not present

## 2021-01-12 DIAGNOSIS — I10 Essential (primary) hypertension: Secondary | ICD-10-CM | POA: Diagnosis not present

## 2021-01-12 DIAGNOSIS — L709 Acne, unspecified: Secondary | ICD-10-CM | POA: Diagnosis not present

## 2021-01-12 LAB — COMPREHENSIVE METABOLIC PANEL
ALT: 21 U/L (ref 0–35)
AST: 16 U/L (ref 0–37)
Albumin: 4.4 g/dL (ref 3.5–5.2)
Alkaline Phosphatase: 81 U/L (ref 39–117)
BUN: 8 mg/dL (ref 6–23)
CO2: 26 mEq/L (ref 19–32)
Calcium: 9.7 mg/dL (ref 8.4–10.5)
Chloride: 102 mEq/L (ref 96–112)
Creatinine, Ser: 0.93 mg/dL (ref 0.40–1.20)
GFR: 74.33 mL/min (ref >60.00)
Glucose, Bld: 91 mg/dL (ref 70–99)
Potassium: 5.1 mEq/L (ref 3.5–5.1)
Sodium: 138 mEq/L (ref 135–145)
Total Bilirubin: 0.3 mg/dL (ref 0.2–1.2)
Total Protein: 7.1 g/dL (ref 6.0–8.3)

## 2021-01-12 LAB — CBC WITH DIFFERENTIAL/PLATELET
Basophils Absolute: 0.1 10*3/uL (ref 0.0–0.1)
Basophils Relative: 1.2 % (ref 0.0–3.0)
Eosinophils Absolute: 0.1 10*3/uL (ref 0.0–0.7)
Eosinophils Relative: 1.8 % (ref 0.0–5.0)
HCT: 39.1 % (ref 36.0–46.0)
Hemoglobin: 13.2 g/dL (ref 12.0–15.0)
Lymphocytes Relative: 27.1 % (ref 12.0–46.0)
Lymphs Abs: 2.2 10*3/uL (ref 0.7–4.0)
MCHC: 33.6 g/dL (ref 30.0–36.0)
MCV: 92.4 fl (ref 78.0–100.0)
Monocytes Absolute: 0.7 10*3/uL (ref 0.1–1.0)
Monocytes Relative: 8.5 % (ref 3.0–12.0)
Neutro Abs: 5 10*3/uL (ref 1.4–7.7)
Neutrophils Relative %: 61.4 % (ref 43.0–77.0)
Platelets: 262 10*3/uL (ref 150.0–400.0)
RBC: 4.23 Mil/uL (ref 3.87–5.11)
RDW: 13.4 % (ref 11.5–15.5)
WBC: 8.1 10*3/uL (ref 4.0–10.5)

## 2021-01-12 LAB — LIPID PANEL
Cholesterol: 246 mg/dL — ABNORMAL HIGH (ref 0–200)
HDL: 80.2 mg/dL (ref >39.00)
LDL Cholesterol: 137 mg/dL — ABNORMAL HIGH (ref 0–99)
NonHDL: 166.08
Total CHOL/HDL Ratio: 3
Triglycerides: 147 mg/dL (ref 0.0–149.0)
VLDL: 29.4 mg/dL (ref 0.0–40.0)

## 2021-01-12 MED ORDER — TRETINOIN 0.05 % EX CREA: TOPICAL_CREAM | Freq: Every day | CUTANEOUS | 2 refills | Status: DC

## 2021-01-12 NOTE — Progress Notes (Signed)
Catherine Barry DOB: [birth year] Encounter date: 01/12/2021  This is a 45 y.o. female who presents with Chief Complaint  Patient presents with  . Follow-up    History of present illness: She sent a mychart message when I was out of office due to concern with elevated bp; had follow up visit with Dr. Regis Bill - started trazodone at night; suggested walking twice daily, avoiding alcohol and if needed double up lisinopril to 75m daily.   She was scared to take trazodone - worried about sleep disturbance.   Hasn't been checking her bp because it stresses her out more. Eating what she wants and drinking more.   She did not increase the lisinopril.    Allergies  Allergen Reactions  . Codeine Other (See Comments)    Heart racing  . Penicillins Rash    Did it involve swelling of the face/tongue/throat, SOB, or low BP? No Did it involve sudden or severe rash/hives, skin peeling, or any reaction on the inside of your mouth or nose? Yes Did you need to seek medical attention at a hospital or doctor's office? Yes When did it last happen?45 y.o. If all above answers are "NO", may proceed with cephalosporin use.    Current Meds  Medication Sig  . lisinopril (ZESTRIL) 5 MG tablet Take 1 tablet (5 mg total) by mouth daily.  .Marland Kitchentretinoin (RETIN-A) 0.05 % cream Apply topically at bedtime.  . [DISCONTINUED] traZODone (DESYREL) 50 MG tablet Take 0.5-1 tablets (25-50 mg total) by mouth at bedtime as needed for sleep.    Review of Systems  Constitutional: Negative for chills, fatigue and fever.  Respiratory: Negative for cough, chest tightness, shortness of breath and wheezing.   Cardiovascular: Negative for chest pain, palpitations and leg swelling.    Objective:  BP 140/90 (BP Location: Left Arm, Patient Position: Sitting, Cuff Size: Normal)   Pulse 98   Temp 98.5 F (36.9 C) (Oral)   Ht 5' 6"  (1.676 m)   Wt 181 lb 14.4 oz (82.5 kg)   SpO2 99%   BMI 29.36 kg/m   Weight: 181 lb  14.4 oz (82.5 kg)   BP Readings from Last 3 Encounters:  01/12/21 140/90  12/07/20 (!) 150/80  08/23/20 128/90   Wt Readings from Last 3 Encounters:  01/12/21 181 lb 14.4 oz (82.5 kg)  12/07/20 181 lb 6.4 oz (82.3 kg)  08/23/20 180 lb 6.4 oz (81.8 kg)    Physical Exam Constitutional:      General: She is not in acute distress.    Appearance: She is well-developed.  Cardiovascular:     Rate and Rhythm: Normal rate and regular rhythm.     Heart sounds: Normal heart sounds. No murmur heard. No friction rub.  Pulmonary:     Effort: Pulmonary effort is normal. No respiratory distress.     Breath sounds: Normal breath sounds. No wheezing or rales.  Musculoskeletal:     Right lower leg: No edema.     Left lower leg: No edema.  Neurological:     Mental Status: She is alert and oriented to person, place, and time.  Psychiatric:        Attention and Perception: Attention normal.        Behavior: Behavior normal.     Comments: She is under a lot of stress. Not choosing healthy behaviors to compensate. Not really exercising; still drinking too much alcohol.      Assessment/Plan  1. Hypertension, unspecified type Increase lisinopril to 136m  daily. She will let me know how pressures are and we will refill based on report.  - CBC with Differential/Platelet; Future - Comprehensive metabolic panel; Future  2. Hyperlipidemia, unspecified hyperlipidemia type - Lipid panel; Future  3. Acne, unspecified acne type Refilled retin A from derm.   Return in about 2 months (around 03/14/2021) for Chronic condition visit.    Micheline Rough, MD

## 2021-01-12 NOTE — Patient Instructions (Addendum)
Consider "9-round" exercise (off [place] garden)  *double up on the lisinopril to take 77m daily. Let me know a couple days before you will be out what your pressures are looking like.

## 2021-01-12 NOTE — Addendum Note (Signed)
Addended by: Elmer Picker on: 01/12/2021 09:09 AM   Modules accepted: Orders

## 2021-02-11 ENCOUNTER — Encounter: Payer: Self-pay | Admitting: Family Medicine

## 2021-02-11 ENCOUNTER — Other Ambulatory Visit: Payer: Self-pay | Admitting: Family Medicine

## 2021-02-11 DIAGNOSIS — I10 Essential (primary) hypertension: Secondary | ICD-10-CM

## 2021-02-11 NOTE — Telephone Encounter (Signed)
FYI

## 2021-02-20 MED ORDER — LISINOPRIL 10 MG PO TABS: 10.0000 mg | ORAL_TABLET | Freq: Every day | ORAL | 1 refills | Status: DC

## 2021-07-27 DIAGNOSIS — Z1231 Encounter for screening mammogram for malignant neoplasm of breast: Secondary | ICD-10-CM | POA: Diagnosis not present

## 2021-07-27 DIAGNOSIS — Z6830 Body mass index (BMI) 30.0-30.9, adult: Secondary | ICD-10-CM | POA: Diagnosis not present

## 2021-07-27 DIAGNOSIS — Z01411 Encounter for gynecological examination (general) (routine) with abnormal findings: Secondary | ICD-10-CM | POA: Diagnosis not present

## 2021-07-27 DIAGNOSIS — Z124 Encounter for screening for malignant neoplasm of cervix: Secondary | ICD-10-CM | POA: Diagnosis not present

## 2021-07-27 DIAGNOSIS — Z01419 Encounter for gynecological examination (general) (routine) without abnormal findings: Secondary | ICD-10-CM | POA: Diagnosis not present

## 2021-08-04 DIAGNOSIS — Z1211 Encounter for screening for malignant neoplasm of colon: Secondary | ICD-10-CM | POA: Diagnosis not present

## 2021-08-04 DIAGNOSIS — Z1212 Encounter for screening for malignant neoplasm of rectum: Secondary | ICD-10-CM | POA: Diagnosis not present

## 2021-08-16 LAB — COLOGUARD: COLOGUARD: NEGATIVE

## 2021-09-14 ENCOUNTER — Ambulatory Visit: Payer: 59 | Admitting: Family Medicine

## 2021-10-07 ENCOUNTER — Encounter: Payer: Self-pay | Admitting: Family Medicine

## 2021-10-07 ENCOUNTER — Ambulatory Visit (INDEPENDENT_AMBULATORY_CARE_PROVIDER_SITE_OTHER): Payer: BC Managed Care – PPO | Admitting: Family Medicine

## 2021-10-07 VITALS — BP 130/82 | HR 104 | Temp 98.1°F | Ht 66.0 in | Wt 179.4 lb

## 2021-10-07 DIAGNOSIS — R030 Elevated blood-pressure reading, without diagnosis of hypertension: Secondary | ICD-10-CM | POA: Diagnosis not present
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
## 2022-06-13 ENCOUNTER — Telehealth: Payer: Self-pay | Admitting: Family Medicine

## 2022-06-13 NOTE — Telephone Encounter (Signed)
Pt aware.

## 2022-06-13 NOTE — Telephone Encounter (Signed)
As long as she understands that the wait might be a while for a new pt appt.  I think these are booking out into January if I remember correctly.

## 2022-08-17 DIAGNOSIS — Z1231 Encounter for screening mammogram for malignant neoplasm of breast: Secondary | ICD-10-CM | POA: Diagnosis not present

## 2022-08-17 DIAGNOSIS — Z124 Encounter for screening for malignant neoplasm of cervix: Secondary | ICD-10-CM | POA: Diagnosis not present

## 2022-08-17 DIAGNOSIS — Z01411 Encounter for gynecological examination (general) (routine) with abnormal findings: Secondary | ICD-10-CM | POA: Diagnosis not present

## 2022-08-17 DIAGNOSIS — Z6830 Body mass index (BMI) 30.0-30.9, adult: Secondary | ICD-10-CM | POA: Diagnosis not present

## 2022-08-17 DIAGNOSIS — Z01419 Encounter for gynecological examination (general) (routine) without abnormal findings: Secondary | ICD-10-CM | POA: Diagnosis not present

## 2022-09-04 ENCOUNTER — Ambulatory Visit: Payer: BC Managed Care – PPO | Admitting: Family Medicine

## 2022-09-12 ENCOUNTER — Ambulatory Visit (INDEPENDENT_AMBULATORY_CARE_PROVIDER_SITE_OTHER): Payer: BC Managed Care – PPO | Admitting: Family Medicine

## 2022-09-12 ENCOUNTER — Encounter: Payer: Self-pay | Admitting: Family Medicine

## 2022-09-12 VITALS — BP 150/93 | HR 87 | Temp 98.6°F | Ht 66.0 in | Wt 177.4 lb

## 2022-09-12 DIAGNOSIS — Z8349 Family history of other endocrine, nutritional and metabolic diseases: Secondary | ICD-10-CM | POA: Diagnosis not present

## 2022-09-12 DIAGNOSIS — I10 Essential (primary) hypertension: Secondary | ICD-10-CM | POA: Diagnosis not present

## 2022-09-12 DIAGNOSIS — Z789 Other specified health status: Secondary | ICD-10-CM | POA: Diagnosis not present

## 2022-09-12 DIAGNOSIS — Z130 Encounter for screening for diseases of the blood and blood-forming organs and certain disorders involving the immune mechanism: Secondary | ICD-10-CM | POA: Diagnosis not present

## 2022-09-12 DIAGNOSIS — R87610 Atypical squamous cells of undetermined significance on cytologic smear of cervix (ASC-US): Secondary | ICD-10-CM

## 2022-09-12 DIAGNOSIS — Z872 Personal history of diseases of the skin and subcutaneous tissue: Secondary | ICD-10-CM | POA: Diagnosis not present

## 2022-09-12 DIAGNOSIS — E785 Hyperlipidemia, unspecified: Secondary | ICD-10-CM

## 2022-09-12 DIAGNOSIS — Z6379 Other stressful life events affecting family and household: Secondary | ICD-10-CM

## 2022-09-12 HISTORY — DX: Personal history of diseases of the skin and subcutaneous tissue: Z87.2

## 2022-09-12 HISTORY — DX: Atypical squamous cells of undetermined significance on cytologic smear of cervix (ASC-US): R87.610
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
## 2022-09-12 NOTE — Patient Instructions (Signed)

## 2022-09-13 LAB — CMP14+EGFR
ALT: 28 IU/L (ref 0–32)
AST: 23 IU/L (ref 0–40)
Albumin/Globulin Ratio: 1.6 (ref 1.2–2.2)
Albumin: 4.3 g/dL (ref 3.9–4.9)
Alkaline Phosphatase: 85 IU/L (ref 44–121)
BUN/Creatinine Ratio: 9 (ref 9–23)
BUN: 8 mg/dL (ref 6–24)
Bilirubin Total: 0.3 mg/dL (ref 0.0–1.2)
CO2: 19 mmol/L — ABNORMAL LOW (ref 20–29)
Calcium: 9.2 mg/dL (ref 8.7–10.2)
Chloride: 101 mmol/L (ref 96–106)
Creatinine, Ser: 0.9 mg/dL (ref 0.57–1.00)
Globulin, Total: 2.7 g/dL (ref 1.5–4.5)
Glucose: 90 mg/dL (ref 70–99)
Potassium: 4.6 mmol/L (ref 3.5–5.2)
Sodium: 136 mmol/L (ref 134–144)
Total Protein: 7 g/dL (ref 6.0–8.5)
eGFR: 80 mL/min/{1.73_m2} (ref >59)

## 2022-09-13 LAB — T4, FREE: Free T4: 1.12 ng/dL (ref 0.82–1.77)

## 2022-09-13 LAB — LIPID PANEL
Chol/HDL Ratio: 2.3 ratio (ref 0.0–4.4)
Cholesterol, Total: 228 mg/dL — ABNORMAL HIGH (ref 100–199)
HDL: 101 mg/dL (ref >39)
LDL Chol Calc (NIH): 116 mg/dL — ABNORMAL HIGH (ref 0–99)
Triglycerides: 62 mg/dL (ref 0–149)
VLDL Cholesterol Cal: 11 mg/dL (ref 5–40)

## 2022-09-13 LAB — CBC
Hematocrit: 39.3 % (ref 34.0–46.6)
Hemoglobin: 13 g/dL (ref 11.1–15.9)
MCH: 30.9 pg (ref 26.6–33.0)
MCHC: 33.1 g/dL (ref 31.5–35.7)
MCV: 93 fL (ref 79–97)
Platelets: 273 10*3/uL (ref 150–450)
RBC: 4.21 x10E6/uL (ref 3.77–5.28)
RDW: 12.2 % (ref 11.7–15.4)
WBC: 9 10*3/uL (ref 3.4–10.8)

## 2022-09-13 LAB — TSH: TSH: 2.32 u[IU]/mL (ref 0.450–4.500)

## 2022-09-26 ENCOUNTER — Ambulatory Visit: Payer: BC Managed Care – PPO

## 2022-09-26 DIAGNOSIS — Z0130 Encounter for examination of blood pressure without abnormal findings: Secondary | ICD-10-CM

## 2022-09-26 NOTE — Progress Notes (Signed)
Patient here today for blood pressure check per Dr. Lajuana Ripple.  Had office visit on 09/12/22, blood pressure was 150/93.  Dr. Lajuana Ripple mentioned in notes to recheck today and if elevated would start Lisinopril 5 or 10 mg.  Blood pressure today was 157/99, pulse 83.  [facility]

## 2022-09-29 ENCOUNTER — Telehealth: Payer: Self-pay | Admitting: Family Medicine

## 2022-09-29 MED ORDER — LISINOPRIL 10 MG PO TABS: 10.0000 mg | ORAL_TABLET | Freq: Every day | ORAL | 3 refills | Status: DC

## 2022-09-29 NOTE — Progress Notes (Signed)
Can someone send Lisinopril Rx to pharmacy for patient? Patient called requesting since MMM approved.

## 2022-09-29 NOTE — Telephone Encounter (Signed)
OV on 09/12/22 stated to have BP rechecked and mentioned if elevated would start Lisinopril 5 or 10 mg. Blood pressure at nurse visit on 09/26/22 was 157/99, pulse 83.  Please advise on starting Lisinopril and which dose. Pt using [place]

## 2022-09-29 NOTE — Telephone Encounter (Signed)
Lisinopril 41m daily- keep diaryu of bloodpressures please.  Mary-Margaret MHassell Done FNP

## 2022-09-29 NOTE — Telephone Encounter (Signed)
Can someone send in Lisinopril Rx for patient since Dr Darnell Level and MMM approved? (See clinical notes from 09/26/2022)

## 2023-01-31 ENCOUNTER — Ambulatory Visit (INDEPENDENT_AMBULATORY_CARE_PROVIDER_SITE_OTHER): Payer: BC Managed Care – PPO | Admitting: Family Medicine

## 2023-01-31 VITALS — BP 132/81 | HR 97 | Temp 98.7°F | Ht 66.0 in | Wt 171.0 lb

## 2023-01-31 DIAGNOSIS — Z91038 Other insect allergy status: Secondary | ICD-10-CM

## 2023-01-31 DIAGNOSIS — L989 Disorder of the skin and subcutaneous tissue, unspecified: Secondary | ICD-10-CM

## 2023-01-31 DIAGNOSIS — L570 Actinic keratosis: Secondary | ICD-10-CM

## 2023-01-31 MED ORDER — TRIAMCINOLONE ACETONIDE 0.1 % EX CREA: 1.0000 | TOPICAL_CREAM | Freq: Two times a day (BID) | CUTANEOUS | 1 refills | Status: DC | PRN

## 2023-01-31 NOTE — Progress Notes (Signed)
Subjective: CC: Skin lesions PCP: Raliegh Ip, DO [id number] Catherine Barry Catherine Barry is a 47 y.o. female presenting to clinic today for:  1.  Skin lesions Patient reports that she has had multiple skin lesions.  She has spots on her cheek and nasal bridge.  She has history of actinic keratoses which previously required treatment but she notes that they specialist that she was seeing for dermatology has only seen her for acute things and would not accept her as a new patient for more chronic and comprehensive exams.  She is asking for referral to dermatology to further investigate the lesion on her cheek.  She also reports a tick bite on her backside, just to the right of her gluteal fold.  This has been about 2 months old but does not seem to want to heal.  She has not put anything on it.  She has triamcinolone cream at home but was reluctant to put it there because of where it was located.  She does report diffuse itching.  No fevers or joint aches reported.   ROS: Per HPI  Allergies  Allergen Reactions   Codeine Other (See Comments)    Heart racing   Penicillins Rash    Did it involve swelling of the face/tongue/throat, SOB, or low BP? No Did it involve sudden or severe rash/hives, skin peeling, or any reaction on the inside of your mouth or nose? Yes Did you need to seek medical attention at a hospital or doctor's office? Yes When did it last happen?    47 y.o.   If all above answers are "NO", may proceed with cephalosporin use.    Past Medical History:  Diagnosis Date   Chicken pox    Frequent headaches    Hx of abnormal cervical Pap smear    cin rx cryo 1999   Hypertension    S/P endometrial ablation    Seasonal allergies     Current Outpatient Medications:    lisinopril (ZESTRIL) 10 MG tablet, Take 1 tablet (10 mg total) by mouth daily., Disp: 90 tablet, Rfl: 3   triamcinolone cream (KENALOG) 0.1 %, Apply 1 Application topically 2 (two) times daily., Disp: , Rfl:  Social  History   Socioeconomic History   Marital status: Married    Spouse name: Catherine Barry   Number of children: 1   Years of education: college+   Highest education level: Master's degree (e.g., MA, MS, MEng, MEd, MSW, MBA)  Occupational History   Occupation: Mining engineer company    Comment: self-employed   Occupation: Surveyor, minerals  Tobacco Use   Smoking status: Never   Smokeless tobacco: Never  Vaping Use   Vaping Use: Never used  Substance and Sexual Activity   Alcohol use: Yes    Alcohol/week: 14.0 standard drinks of alcohol    Types: 14 Cans of beer per week   Drug use: No   Sexual activity: Yes    Partners: Male    Birth control/protection: Other-see comments    Comment: ablation  Other Topics Concern   Not on file  Social History Narrative   Lives with her husband and 69 year old girl, Catherine Barry   Enjoys hunting   Currently flipping a Building control surveyor, Electronics engineer; masters level education.   Has 4 patents.      LIFESTYLE:    Exercise: active   Tobacco/ETS:no   Alcohol: 14+ beers per week   Sugar beverages:  lots of  diet dr pepper  Drug use: no   Social Determinants of Health   Financial Resource Strain: Low Risk  (01/31/2023)   Overall Financial Resource Strain (CARDIA)    Difficulty of Paying Living Expenses: Not hard at all  Food Insecurity: No Food Insecurity (01/31/2023)   Hunger Vital Sign    Worried About Running Out of Food in the Last Year: Never true    Ran Out of Food in the Last Year: Never true  Transportation Needs: No Transportation Needs (01/31/2023)   PRAPARE - Administrator, Civil Service (Medical): No    Lack of Transportation (Non-Medical): No  Physical Activity: Unknown (01/31/2023)   Exercise Vital Sign    Days of Exercise per Week: Patient declined    Minutes of Exercise per Session: Not on file  Stress: Stress Concern Present (01/31/2023)   [employer] of Occupational Health - Occupational Stress  Questionnaire    Feeling of Stress : To some extent  Social Connections: Unknown (01/31/2023)   Social Connection and Isolation Panel [NHANES]    Frequency of Communication with Friends and Family: Patient declined    Frequency of Social Gatherings with Friends and Family: Never    Attends Religious Services: Never    Diplomatic Services operational officer: No    Attends Engineer, structural: Not on file    Marital Status: Married  Catering manager Violence: Not on file   Family History  Problem Relation Age of Onset   Hyperlipidemia Mother    Hypertension Mother    Thyroid disease Mother        hypothyroidism   Stroke Mother        TIA   Sjogren's syndrome Mother    Liver disease Mother        non alcoholic liver disease   Non-Hodgkin's lymphoma Mother    Eating disorder Daughter    Alcohol abuse Maternal Grandmother     Objective: Office vital signs reviewed. BP 132/81   Pulse 97   Temp 98.7 F (37.1 C)   Ht 5\' 6"  (1.676 m)   Wt 171 lb (77.6 kg)   SpO2 96%   BMI 27.60 kg/m   Physical Examination:  General: Awake, alert, well nourished, No acute distress Skin: Left cheek with a pearlescent lesion.  No central umbilication but there is some slight hypervascularity.  This is less than 1 mm in circumference.  She has a hyperkeratotic but flesh-colored spot on the nasal bridge.  No appreciable bleeding or vascularity.  Right gluteal cleft with healing insect bite.  No evidence of secondary bacterial infection.  Cryotherapy Procedure:  Risks and benefits of procedure were reviewed with the patient.  Verbal consent obtained and scanned into the chart.  Lesion of concern was identified and located on nasal bridge.  Liquid nitrogen was applied to area of concern and extending out 1 millimeters beyond the border of the lesion.  Treated area was allowed to come back to room temperature before treating it a second time.  Patient tolerated procedure well and there were no  immediate complications.  Home care instructions were reviewed with the patient and a handout was provided.   Assessment/ Plan: 47 y.o. female   Allergic reaction to insect bite - Plan: triamcinolone cream (KENALOG) 0.1 %  Skin lesion of cheek - Plan: Ambulatory referral to Dermatology, CANCELED: Ambulatory referral to Dermatology  Actinic keratosis  Triamcinolone cream to affected area.  Avoid scratching  Referral to dermatology, Concha Norway in [place], placed.  I treated the actinic keratosis on the nasal bridge with cryoablation today.  The lesion on the left cheek that was suspicious for possible basal cell and I recommend further evaluation under dermatoscope with dermatology.  Given the area I was hesitant to do a punch biopsy in this region  No orders of the defined types were placed in this encounter.  No orders of the defined types were placed in this encounter.    Raliegh Ip, DO [facility] [phone]

## 2023-02-07 ENCOUNTER — Encounter: Payer: Self-pay | Admitting: Family Medicine

## 2023-03-01 DIAGNOSIS — L820 Inflamed seborrheic keratosis: Secondary | ICD-10-CM | POA: Diagnosis not present

## 2023-03-01 DIAGNOSIS — L718 Other rosacea: Secondary | ICD-10-CM | POA: Diagnosis not present

## 2023-03-01 DIAGNOSIS — L538 Other specified erythematous conditions: Secondary | ICD-10-CM | POA: Diagnosis not present

## 2023-03-01 DIAGNOSIS — D485 Neoplasm of uncertain behavior of skin: Secondary | ICD-10-CM | POA: Diagnosis not present

## 2023-03-26 ENCOUNTER — Encounter: Payer: Self-pay | Admitting: Family Medicine

## 2023-06-05 DIAGNOSIS — L820 Inflamed seborrheic keratosis: Secondary | ICD-10-CM | POA: Diagnosis not present

## 2023-06-05 DIAGNOSIS — D485 Neoplasm of uncertain behavior of skin: Secondary | ICD-10-CM | POA: Diagnosis not present

## 2023-06-05 DIAGNOSIS — D225 Melanocytic nevi of trunk: Secondary | ICD-10-CM | POA: Diagnosis not present

## 2023-06-05 DIAGNOSIS — L821 Other seborrheic keratosis: Secondary | ICD-10-CM | POA: Diagnosis not present

## 2023-06-05 DIAGNOSIS — L814 Other melanin hyperpigmentation: Secondary | ICD-10-CM | POA: Diagnosis not present

## 2023-06-05 DIAGNOSIS — L538 Other specified erythematous conditions: Secondary | ICD-10-CM | POA: Diagnosis not present

## 2023-09-06 DIAGNOSIS — Z1331 Encounter for screening for depression: Secondary | ICD-10-CM | POA: Diagnosis not present

## 2023-09-06 DIAGNOSIS — R87610 Atypical squamous cells of undetermined significance on cytologic smear of cervix (ASC-US): Secondary | ICD-10-CM | POA: Diagnosis not present

## 2023-09-06 DIAGNOSIS — Z01419 Encounter for gynecological examination (general) (routine) without abnormal findings: Secondary | ICD-10-CM | POA: Diagnosis not present

## 2023-09-06 DIAGNOSIS — Z1231 Encounter for screening mammogram for malignant neoplasm of breast: Secondary | ICD-10-CM | POA: Diagnosis not present

## 2023-09-08 ENCOUNTER — Other Ambulatory Visit: Payer: Self-pay | Admitting: Nurse Practitioner

## 2023-09-19 ENCOUNTER — Other Ambulatory Visit: Payer: Self-pay

## 2023-09-19 NOTE — Telephone Encounter (Signed)
This was sent 09/10/2023

## 2023-09-20 ENCOUNTER — Encounter: Payer: Self-pay | Admitting: Family Medicine

## 2023-09-21 ENCOUNTER — Ambulatory Visit (INDEPENDENT_AMBULATORY_CARE_PROVIDER_SITE_OTHER): Payer: BC Managed Care – PPO | Admitting: Family Medicine

## 2023-09-21 ENCOUNTER — Encounter: Payer: Self-pay | Admitting: Family Medicine

## 2023-09-21 VITALS — BP 136/83 | HR 100 | Temp 98.0°F | Ht 66.0 in | Wt 183.6 lb

## 2023-09-21 DIAGNOSIS — E782 Mixed hyperlipidemia: Secondary | ICD-10-CM | POA: Diagnosis not present

## 2023-09-21 DIAGNOSIS — Z91038 Other insect allergy status: Secondary | ICD-10-CM

## 2023-09-21 DIAGNOSIS — I10 Essential (primary) hypertension: Secondary | ICD-10-CM | POA: Diagnosis not present

## 2023-09-21 DIAGNOSIS — L709 Acne, unspecified: Secondary | ICD-10-CM

## 2023-09-21 DIAGNOSIS — L719 Rosacea, unspecified: Secondary | ICD-10-CM | POA: Diagnosis not present

## 2023-09-21 MED ORDER — TRIAMCINOLONE ACETONIDE 0.1 % EX CREA: 1.0000 | TOPICAL_CREAM | Freq: Two times a day (BID) | CUTANEOUS | 1 refills | Status: AC | PRN

## 2023-09-21 MED ORDER — METRONIDAZOLE 0.75 % EX CREA: TOPICAL_CREAM | Freq: Two times a day (BID) | CUTANEOUS | 3 refills | Status: DC

## 2023-09-21 MED ORDER — LISINOPRIL 10 MG PO TABS: 10.0000 mg | ORAL_TABLET | Freq: Every day | ORAL | 4 refills | Status: DC

## 2023-09-21 MED ORDER — TRETINOIN 0.05 % EX CREA: TOPICAL_CREAM | Freq: Every day | CUTANEOUS | 3 refills | Status: DC

## 2023-09-21 NOTE — Progress Notes (Signed)
Subjective: CC: hypertension follow-up PCP: Raliegh Ip, DO [id number] Catherine Barry is a 48 y.o. female presenting to clinic today for:  1.  Hypertension associate with hyperlipidemia Patient reports compliance with lisinopril.  No chest pain, shortness of breath, edema, blurred vision or headache  2.  Acne, rosacea Patient was under the care of dermatology who is prescribed Retin-A 0.05% and metronidazole cream.  She utilizes this when she starts getting flareups of acne.  Needs refills on both.  Also needs refills on triamcinolone cream which she uses as needed insect bites.  Typically gets allergic reactions to these   ROS: Per HPI  Allergies  Allergen Reactions   Codeine Other (See Comments)    Heart racing   Penicillins Rash    Did it involve swelling of the face/tongue/throat, SOB, or low BP? No Did it involve sudden or severe rash/hives, skin peeling, or any reaction on the inside of your mouth or nose? Yes Did you need to seek medical attention at a hospital or doctor's office? Yes When did it last happen?    48 y.o.   If all above answers are "NO", may proceed with cephalosporin use.    Past Medical History:  Diagnosis Date   Chicken pox    Frequent headaches    Hx of abnormal cervical Pap smear    cin rx cryo 1999   Hypertension    S/P endometrial ablation    Seasonal allergies     Current Outpatient Medications:    metroNIDAZOLE (METROCREAM) 0.75 % cream, Apply topically 2 (two) times daily. For rosacea/ acne, Disp: 45 g, Rfl: 3   tretinoin (RETIN-A) 0.05 % cream, Apply topically at bedtime., Disp: 45 g, Rfl: 3   lisinopril (ZESTRIL) 10 MG tablet, Take 1 tablet (10 mg total) by mouth daily., Disp: 90 tablet, Rfl: 4   triamcinolone cream (KENALOG) 0.1 %, Apply 1 Application topically 2 (two) times daily as needed., Disp: 30 g, Rfl: 1 Social History   Socioeconomic History   Marital status: Married    Spouse name: Cristal Deer   Number of children: 1    Years of education: college+   Highest education level: Master's degree (e.g., MA, MS, MEng, MEd, MSW, MBA)  Occupational History   Occupation: Mining engineer company    Comment: self-employed   Occupation: Surveyor, minerals  Tobacco Use   Smoking status: Never   Smokeless tobacco: Never  Vaping Use   Vaping status: Never Used  Substance and Sexual Activity   Alcohol use: Yes    Alcohol/week: 14.0 standard drinks of alcohol    Types: 14 Cans of beer per week   Drug use: No   Sexual activity: Yes    Partners: Male    Birth control/protection: Other-see comments    Comment: ablation  Other Topics Concern   Not on file  Social History Narrative   Lives with her husband and 74 year old girl, Carly   Enjoys hunting   Currently flipping a Building control surveyor, Electronics engineer; masters level education.   Has 4 patents.      LIFESTYLE:    Exercise: active   Tobacco/ETS:no   Alcohol: 14+ beers per week   Sugar beverages:  lots of  diet dr pepper    Drug use: no   Social Drivers of Corporate investment banker Strain: Low Risk  (01/31/2023)   Overall Financial Resource Strain (CARDIA)    Difficulty of Paying Living Expenses: Not hard at all  Food Insecurity: No Food Insecurity (01/31/2023)   Hunger Vital Sign    Worried About Running Out of Food in the Last Year: Never true    Ran Out of Food in the Last Year: Never true  Transportation Needs: No Transportation Needs (01/31/2023)   PRAPARE - Administrator, Civil Service (Medical): No    Lack of Transportation (Non-Medical): No  Physical Activity: Unknown (01/31/2023)   Exercise Vital Sign    Days of Exercise per Week: Patient declined    Minutes of Exercise per Session: Not on file  Stress: Stress Concern Present (01/31/2023)   [employer] of Occupational Health - Occupational Stress Questionnaire    Feeling of Stress : To some extent  Social Connections: Unknown (01/31/2023)   Social Connection and  Isolation Panel [NHANES]    Frequency of Communication with Friends and Family: Patient declined    Frequency of Social Gatherings with Friends and Family: Never    Attends Religious Services: Never    Diplomatic Services operational officer: No    Attends Engineer, structural: Not on file    Marital Status: Married  Catering manager Violence: Not on file   Family History  Problem Relation Age of Onset   Hyperlipidemia Mother    Hypertension Mother    Thyroid disease Mother        hypothyroidism   Stroke Mother        TIA   Sjogren's syndrome Mother    Liver disease Mother        non alcoholic liver disease   Non-Hodgkin's lymphoma Mother    Eating disorder Daughter    Alcohol abuse Maternal Grandmother     Objective: Office vital signs reviewed. BP 136/83   Pulse 100   Temp 98 F (36.7 C)   Ht 5\' 6"  (1.676 m)   Wt 183 lb 9.6 oz (83.3 kg)   SpO2 98%   BMI 29.63 kg/m   Physical Examination:  General: Awake, alert, well nourished, No acute distress HEENT: sclera white, MMM Cardio: regular rate and rhythm, S1S2 heard, no murmurs appreciated Pulm: clear to auscultation bilaterally, no wheezes, rhonchi or rales; normal work of breathing on room air Skin: Few open comedones noted along the cheeks  Assessment/ Plan: 48 y.o. female   Essential hypertension - Plan: lisinopril (ZESTRIL) 10 MG tablet, CMP14+EGFR  Allergic reaction to insect bite - Plan: triamcinolone cream (KENALOG) 0.1 %  Moderate mixed hyperlipidemia not requiring statin therapy - Plan: Lipid panel  Rosacea - Plan: CBC, metroNIDAZOLE (METROCREAM) 0.75 % cream, tretinoin (RETIN-A) 0.05 % cream  Adult acne - Plan: tretinoin (RETIN-A) 0.05 % cream  Blood pressure well-controlled.  Continue current regimen.  Refill sent.  Future order for metabolic panel placed.  Not on any statin for cholesterol.  Fasting labs placed.  She will schedule physical  Triamcinolone, metronidazole cream and Retin-A  sent.  All skin issues are stable   Raliegh Ip, DO [facility] [phone]

## 2024-05-06 ENCOUNTER — Other Ambulatory Visit: Payer: Self-pay | Admitting: Medical Genetics

## 2024-05-09 ENCOUNTER — Other Ambulatory Visit: Payer: BC Managed Care – PPO

## 2024-05-09 DIAGNOSIS — L719 Rosacea, unspecified: Secondary | ICD-10-CM

## 2024-05-09 DIAGNOSIS — I10 Essential (primary) hypertension: Secondary | ICD-10-CM

## 2024-05-09 DIAGNOSIS — E782 Mixed hyperlipidemia: Secondary | ICD-10-CM

## 2024-05-09 LAB — LIPID PANEL

## 2024-05-10 LAB — LIPID PANEL
Chol/HDL Ratio: 3.2 ratio (ref 0.0–4.4)
Cholesterol, Total: 268 mg/dL — ABNORMAL HIGH (ref 100–199)
HDL: 83 mg/dL (ref >39)
LDL Chol Calc (NIH): 171 mg/dL — ABNORMAL HIGH (ref 0–99)
Triglycerides: 84 mg/dL (ref 0–149)
VLDL Cholesterol Cal: 14 mg/dL (ref 5–40)

## 2024-05-10 LAB — CMP14+EGFR
ALT: 20 IU/L (ref 0–32)
AST: 16 IU/L (ref 0–40)
Albumin: 4.2 g/dL (ref 3.9–4.9)
Alkaline Phosphatase: 104 IU/L (ref 41–116)
BUN/Creatinine Ratio: 9 (ref 9–23)
BUN: 8 mg/dL (ref 6–24)
Bilirubin Total: 0.3 mg/dL (ref 0.0–1.2)
CO2: 21 mmol/L (ref 20–29)
Calcium: 9.8 mg/dL (ref 8.7–10.2)
Chloride: 102 mmol/L (ref 96–106)
Creatinine, Ser: 0.88 mg/dL (ref 0.57–1.00)
Globulin, Total: 2.6 g/dL (ref 1.5–4.5)
Glucose: 98 mg/dL (ref 70–99)
Potassium: 5.2 mmol/L (ref 3.5–5.2)
Sodium: 139 mmol/L (ref 134–144)
Total Protein: 6.8 g/dL (ref 6.0–8.5)
eGFR: 81 mL/min/1.73

## 2024-05-10 LAB — CBC
Hematocrit: 40 % (ref 34.0–46.6)
Hemoglobin: 13 g/dL (ref 11.1–15.9)
MCH: 30.7 pg (ref 26.6–33.0)
MCHC: 32.5 g/dL (ref 31.5–35.7)
MCV: 95 fL (ref 79–97)
Platelets: 283 x10E3/uL (ref 150–450)
RBC: 4.23 x10E6/uL (ref 3.77–5.28)
RDW: 12.1 % (ref 11.7–15.4)
WBC: 9.6 x10E3/uL (ref 3.4–10.8)

## 2024-05-12 ENCOUNTER — Ambulatory Visit (INDEPENDENT_AMBULATORY_CARE_PROVIDER_SITE_OTHER): Payer: BC Managed Care – PPO | Admitting: Family Medicine

## 2024-05-12 ENCOUNTER — Ambulatory Visit: Payer: Self-pay | Admitting: Family Medicine

## 2024-05-12 ENCOUNTER — Encounter: Payer: Self-pay | Admitting: Family Medicine

## 2024-05-12 VITALS — BP 116/72 | HR 94 | Temp 98.2°F | Ht 66.0 in | Wt 178.4 lb

## 2024-05-12 DIAGNOSIS — Z0000 Encounter for general adult medical examination without abnormal findings: Secondary | ICD-10-CM

## 2024-05-12 DIAGNOSIS — L719 Rosacea, unspecified: Secondary | ICD-10-CM

## 2024-05-12 DIAGNOSIS — Z0001 Encounter for general adult medical examination with abnormal findings: Secondary | ICD-10-CM

## 2024-05-12 DIAGNOSIS — E782 Mixed hyperlipidemia: Secondary | ICD-10-CM | POA: Diagnosis not present

## 2024-05-12 DIAGNOSIS — I10 Essential (primary) hypertension: Secondary | ICD-10-CM | POA: Diagnosis not present

## 2024-05-12 DIAGNOSIS — Z1211 Encounter for screening for malignant neoplasm of colon: Secondary | ICD-10-CM | POA: Diagnosis not present

## 2024-05-12 DIAGNOSIS — L709 Acne, unspecified: Secondary | ICD-10-CM

## 2024-05-12 MED ORDER — METRONIDAZOLE 0.75 % EX CREA: TOPICAL_CREAM | Freq: Two times a day (BID) | CUTANEOUS | 3 refills | Status: AC

## 2024-05-12 MED ORDER — TRETINOIN 0.05 % EX CREA
TOPICAL_CREAM | Freq: Every day | CUTANEOUS | 3 refills | Status: AC
Start: 2024-05-12 — End: ?

## 2024-05-12 MED ORDER — LISINOPRIL 10 MG PO TABS: 10.0000 mg | ORAL_TABLET | Freq: Every day | ORAL | 4 refills | Status: AC
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
## 2024-05-12 NOTE — Patient Instructions (Addendum)
 Get mammo done Cologuard ordered for December. Repeat fasting cholesterol in March Best of luck with everything in November!!!  High Cholesterol  High cholesterol is a condition in which the blood has high levels of a white, waxy substance similar to fat (cholesterol). The liver makes all the cholesterol that the body needs. The human body needs small amounts of cholesterol to help build cells. A person gets extra or excess cholesterol from the food that he or she eats. The blood carries cholesterol from the liver to the rest of the body. If you have high cholesterol, deposits (plaques) may build up on the walls of your arteries. Arteries are the blood vessels that carry blood away from your heart. These plaques make the arteries narrow and stiff. Cholesterol plaques increase your risk for heart attack and stroke. Work with your health care provider to keep your cholesterol levels in a healthy range. What increases the risk? The following factors may make you more likely to develop this condition: Eating foods that are high in animal fat (saturated fat) or cholesterol. Being overweight. Not getting enough exercise. A family history of high cholesterol (familial hypercholesterolemia). Use of tobacco products. Having diabetes. What are the signs or symptoms? In most cases, high cholesterol does not usually cause any symptoms. In severe cases, very high cholesterol levels can cause: Fatty bumps under the skin (xanthomas). A white or gray ring around the black center (pupil) of the eye. How is this diagnosed? This condition may be diagnosed based on the results of a blood test. If you are older than 48 years of age, your health care provider may check your cholesterol levels every 4-6 years. You may be checked more often if you have high cholesterol or other risk factors for heart disease. The blood test for cholesterol measures: Bad cholesterol, or LDL cholesterol. This is the main type of  cholesterol that causes heart disease. The desired level is less than 100 mg/dL (7.40 mmol/L). Good cholesterol, or HDL cholesterol. HDL helps protect against heart disease by cleaning the arteries and carrying the LDL to the liver for processing. The desired level for HDL is 60 mg/dL (8.44 mmol/L) or higher. Triglycerides. These are fats that your body can store or burn for energy. The desired level is less than 150 mg/dL (8.30 mmol/L). Total cholesterol. This measures the total amount of cholesterol in your blood and includes LDL, HDL, and triglycerides. The desired level is less than 200 mg/dL (4.82 mmol/L). How is this treated? Treatment for high cholesterol starts with lifestyle changes, such as diet and exercise. Diet changes. You may be asked to eat foods that have more fiber and less saturated fats or added sugar. Lifestyle changes. These may include regular exercise, maintaining a healthy weight, and quitting use of tobacco products. Medicines. These are given when diet and lifestyle changes have not worked. You may be prescribed a statin medicine to help lower your cholesterol levels. Follow these instructions at home: Eating and drinking  Eat a healthy, balanced diet. This diet includes: Daily servings of a variety of fresh, frozen, or canned fruits and vegetables. Daily servings of whole grain foods that are rich in fiber. Foods that are low in saturated fats and trans fats. These include poultry and fish without skin, lean cuts of meat, and low-fat dairy products. A variety of fish, especially oily fish that contain omega-3 fatty acids. Aim to eat fish at least 2 times a week. Avoid foods and drinks that have added sugar. Use  healthy cooking methods, such as roasting, grilling, broiling, baking, poaching, steaming, and stir-frying. Do not fry your food except for stir-frying. If you drink alcohol: Limit how much you have to: 0-1 drink a day for women who are not pregnant. 0-2  drinks a day for men. Know how much alcohol is in a drink. In the [place] one drink equals one 12 oz bottle of beer (355 mL), one 5 oz glass of wine (148 mL), or one 1 oz glass of hard liquor (44 mL). Lifestyle  Get regular exercise. Aim to exercise for a total of 150 minutes a week. Increase your activity level by doing activities such as gardening, walking, and taking the stairs. Do not use any products that contain nicotine or tobacco. These products include cigarettes, chewing tobacco, and vaping devices, such as e-cigarettes. If you need help quitting, ask your health care provider. General instructions Take over-the-counter and prescription medicines only as told by your health care provider. Keep all follow-up visits. This is important. Where to find more information American Heart Association: www.heart.org National Heart, Lung, and Blood Institute: PopSte[employer].is Contact a health care provider if: You have trouble achieving or maintaining a healthy diet or weight. You are starting an exercise program. You are unable to stop smoking. Get help right away if: You have chest pain. You have trouble breathing. You have discomfort or pain in your jaw, neck, back, shoulder, or arm. You have any symptoms of a stroke. BE FAST is an easy way to remember the main warning signs of a stroke: B - Balance. Signs are dizziness, sudden trouble walking, or loss of balance. E - Eyes. Signs are trouble seeing or a sudden change in vision. F - Face. Signs are sudden weakness or numbness of the face, or the face or eyelid drooping on one side. A - Arms. Signs are weakness or numbness in an arm. This happens suddenly and usually on one side of the body. S - Speech. Signs are sudden trouble speaking, slurred speech, or trouble understanding what people say. T - Time. Time to call emergency services. Write down what time symptoms started. You have other signs of a stroke, such as: A sudden, severe  headache with no known cause. Nausea or vomiting. Seizure. These symptoms may represent a serious problem that is an emergency. Do not wait to see if the symptoms will go away. Get medical help right away. Call your local emergency services (911 in the [place] Do not drive yourself to the hospital. Summary Cholesterol plaques increase your risk for heart attack and stroke. Work with your health care provider to keep your cholesterol levels in a healthy range. Eat a healthy, balanced diet, get regular exercise, and maintain a healthy weight. Do not use any products that contain nicotine or tobacco. These products include cigarettes, chewing tobacco, and vaping devices, such as e-cigarettes. Get help right away if you have any symptoms of a stroke. This information is not intended to replace advice given to you by your health care provider. Make sure you discuss any questions you have with your health care provider. Document Revised: 03/10/2022 Document Reviewed: 10/11/2020 Elsevier Patient Education  2024 [employer].

## 2024-06-06 DIAGNOSIS — L820 Inflamed seborrheic keratosis: Secondary | ICD-10-CM | POA: Diagnosis not present

## 2024-09-08 ENCOUNTER — Other Ambulatory Visit (HOSPITAL_COMMUNITY): Payer: Self-pay | Admitting: Advanced Practice Midwife

## 2024-09-08 DIAGNOSIS — Z1231 Encounter for screening mammogram for malignant neoplasm of breast: Secondary | ICD-10-CM

## 2024-09-29 ENCOUNTER — Encounter (HOSPITAL_COMMUNITY)

## 2024-09-29 DIAGNOSIS — Z1231 Encounter for screening mammogram for malignant neoplasm of breast: Secondary | ICD-10-CM

## 2024-10-06 ENCOUNTER — Encounter: Admitting: Advanced Practice Midwife

## 2024-10-06 ENCOUNTER — Ambulatory Visit (HOSPITAL_COMMUNITY)

## 2024-10-20 ENCOUNTER — Ambulatory Visit (HOSPITAL_COMMUNITY)

## 2024-11-10 ENCOUNTER — Other Ambulatory Visit: Payer: Self-pay

## 2024-11-10 ENCOUNTER — Encounter: Admitting: Obstetrics & Gynecology

## 2025-05-13 ENCOUNTER — Encounter: Payer: Self-pay | Admitting: Family Medicine
